# Patient Record
Sex: Female | Born: 1995 | Race: Black or African American | Hispanic: No | Marital: Single | Smoking: Never smoker
Health system: Southern US, Community
[De-identification: ages and names within clinical notes are randomized; demographics above are authoritative.]

## PROBLEM LIST (undated history)

## (undated) ENCOUNTER — Inpatient Hospital Stay (HOSPITAL_COMMUNITY): Payer: Self-pay

## (undated) ENCOUNTER — Ambulatory Visit (HOSPITAL_COMMUNITY): Admission: RE | Payer: Medicaid Other | Source: Home / Self Care | Admitting: Obstetrics & Gynecology

## (undated) DIAGNOSIS — E282 Polycystic ovarian syndrome: Secondary | ICD-10-CM

## (undated) HISTORY — PX: NO PAST SURGERIES: SHX2092

## (undated) HISTORY — DX: Polycystic ovarian syndrome: E28.2

## (undated) SURGERY — Surgical Case
Anesthesia: *Unknown

---

## 2014-02-03 ENCOUNTER — Encounter (HOSPITAL_COMMUNITY): Payer: Self-pay | Admitting: Emergency Medicine

## 2014-02-03 ENCOUNTER — Emergency Department (HOSPITAL_COMMUNITY)
Admission: EM | Admit: 2014-02-03 | Discharge: 2014-02-03 | Disposition: A | Discharge status: Home / Self Care | Payer: BC Managed Care – PPO | Attending: Emergency Medicine | Admitting: Emergency Medicine

## 2014-02-03 ENCOUNTER — Emergency Department (HOSPITAL_COMMUNITY): Payer: BC Managed Care – PPO

## 2014-02-03 DIAGNOSIS — R1032 Left lower quadrant pain: Secondary | ICD-10-CM

## 2014-02-03 DIAGNOSIS — N921 Excessive and frequent menstruation with irregular cycle: Secondary | ICD-10-CM

## 2014-02-03 DIAGNOSIS — N926 Irregular menstruation, unspecified: Secondary | ICD-10-CM | POA: Insufficient documentation

## 2014-02-03 DIAGNOSIS — R1031 Right lower quadrant pain: Secondary | ICD-10-CM

## 2014-02-03 DIAGNOSIS — N920 Excessive and frequent menstruation with regular cycle: Secondary | ICD-10-CM | POA: Insufficient documentation

## 2014-02-03 DIAGNOSIS — R51 Headache: Secondary | ICD-10-CM | POA: Insufficient documentation

## 2014-02-03 DIAGNOSIS — Z3202 Encounter for pregnancy test, result negative: Secondary | ICD-10-CM | POA: Insufficient documentation

## 2014-02-03 DIAGNOSIS — R1904 Left lower quadrant abdominal swelling, mass and lump: Secondary | ICD-10-CM | POA: Insufficient documentation

## 2014-02-03 DIAGNOSIS — R42 Dizziness and giddiness: Secondary | ICD-10-CM | POA: Insufficient documentation

## 2014-02-03 LAB — URINALYSIS, ROUTINE W REFLEX MICROSCOPIC
Bilirubin Urine: NEGATIVE
Glucose, UA: NEGATIVE mg/dL
Hgb urine dipstick: NEGATIVE
KETONES UR: NEGATIVE mg/dL
Leukocytes, UA: NEGATIVE
NITRITE: NEGATIVE
PH: 6.5 (ref 5.0–8.0)
Protein, ur: NEGATIVE mg/dL
SPECIFIC GRAVITY, URINE: 1.02 (ref 1.005–1.030)
Urobilinogen, UA: 1 mg/dL (ref 0.0–1.0)

## 2014-02-03 LAB — PREGNANCY, URINE: Preg Test, Ur: NEGATIVE

## 2014-02-03 LAB — CBC WITH DIFFERENTIAL/PLATELET
BASOS ABS: 0 10*3/uL (ref 0.0–0.1)
BASOS PCT: 0 % (ref 0–1)
EOS ABS: 0.1 10*3/uL (ref 0.0–0.7)
EOS PCT: 1 % (ref 0–5)
HCT: 36.7 % (ref 36.0–46.0)
Hemoglobin: 12.3 g/dL (ref 12.0–15.0)
Lymphocytes Relative: 45 % (ref 12–46)
Lymphs Abs: 3 10*3/uL (ref 0.7–4.0)
MCH: 31.5 pg (ref 26.0–34.0)
MCHC: 33.5 g/dL (ref 30.0–36.0)
MCV: 93.9 fL (ref 78.0–100.0)
Monocytes Absolute: 0.5 10*3/uL (ref 0.1–1.0)
Monocytes Relative: 7 % (ref 3–12)
Neutro Abs: 3.1 10*3/uL (ref 1.7–7.7)
Neutrophils Relative %: 47 % (ref 43–77)
PLATELETS: 266 10*3/uL (ref 150–400)
RBC: 3.91 MIL/uL (ref 3.87–5.11)
RDW: 12.4 % (ref 11.5–15.5)
WBC: 6.5 10*3/uL (ref 4.0–10.5)

## 2014-02-03 LAB — COMPREHENSIVE METABOLIC PANEL
ALBUMIN: 4.3 g/dL (ref 3.5–5.2)
ALK PHOS: 73 U/L (ref 39–117)
ALT: 8 U/L (ref 0–35)
AST: 17 U/L (ref 0–37)
BUN: 14 mg/dL (ref 6–23)
CALCIUM: 9.7 mg/dL (ref 8.4–10.5)
CO2: 26 mEq/L (ref 19–32)
Chloride: 101 mEq/L (ref 96–112)
Creatinine, Ser: 0.85 mg/dL (ref 0.50–1.10)
GFR calc non Af Amer: >90 mL/min (ref >90)
Glucose, Bld: 93 mg/dL (ref 70–99)
POTASSIUM: 4.2 meq/L (ref 3.7–5.3)
SODIUM: 142 meq/L (ref 137–147)
TOTAL PROTEIN: 7.4 g/dL (ref 6.0–8.3)
Total Bilirubin: 0.2 mg/dL — ABNORMAL LOW (ref 0.3–1.2)

## 2014-02-03 LAB — WET PREP, GENITAL
Trich, Wet Prep: NONE SEEN
Yeast Wet Prep HPF POC: NONE SEEN

## 2014-02-03 MED ORDER — NAPROXEN 500 MG PO TABS: 500.0000 mg | ORAL_TABLET | Freq: Two times a day (BID) | ORAL | Status: DC

## 2014-02-03 MED ORDER — KETOROLAC TROMETHAMINE 30 MG/ML IJ SOLN
30.0000 mg | Freq: Once | INTRAMUSCULAR | Status: AC
  Administered 2014-02-03: 30 mg via INTRAVENOUS
  Filled 2014-02-03: qty 1

## 2014-02-03 MED ORDER — HYDROCODONE-ACETAMINOPHEN 5-325 MG PO TABS
1.0000 | ORAL_TABLET | Freq: Once | ORAL | Status: AC
  Administered 2014-02-03: 1 via ORAL
  Filled 2014-02-03: qty 1

## 2014-02-03 MED ORDER — PROMETHAZINE HCL 25 MG PO TABS: 25.0000 mg | ORAL_TABLET | Freq: Four times a day (QID) | ORAL | Status: DC | PRN

## 2014-02-03 NOTE — ED Notes (Addendum)
Pt states she will skip a month occasionally with her menstrual cycle.  Pt last menstrual cycle December.  Pt states she has had heavy bleeding x 2 weeks.  Pt has taken 2 home pregnancy tests which are negative.  Pt states she occasionally gets a lump to her L pelvic area that gets bigger and is painful and then goes away.

## 2014-02-03 NOTE — ED Notes (Signed)
Verified with lab that urine specimen was received.

## 2014-02-03 NOTE — ED Provider Notes (Signed)
CSN: [id number]     Arrival date & time 02/03/14  1740 History   First MD Initiated Contact with Patient 02/03/14 1815     Chief Complaint  Patient presents with  . Vaginal Bleeding     (Consider location/radiation/quality/duration/timing/severity/associated sxs/prior Treatment) HPI  18 year old G0 P0 female with no significant past medical history presents for evaluation of vaginal bleeding. Patient reports for the past 2 weeks she has had progressive vaginal bleeding along with lower abdominal pain, and headache. Vaginal bleeding initially was light, but has been progressively worse. She is averaging about 2-4 tampons a day. Abdominal pain is described as a crampy sensation, radiates to both upper thighs, lower back, persistent, worsening with palpation. She occasionally will feel a knot in the left lower abdomen which at times expands and contracts. She has had this knot for the past 3 years. The knot is nonpainful. She endorses throbbing headache with sensation of lightheadedness and the status for the past 2 weeks. Headaches improved with taken Advil, ibuprofen, Aleve, and Tylenol at different times. She also endorsed low back pain for the same duration. Her last menstrual period was December 12. She is taking a pregnancy test twice most recently was 2 months ago and was negative. She is here for evaluation due to the chronicity of her complaints. She is sexually active with one partner, using protection on occasion. She denies fever, chills, neck stiffness, chest pain, shortness of breath, productive cough, hematuria, vaginal discharge, or rash.  History reviewed. No pertinent past medical history. History reviewed. No pertinent past surgical history. No family history on file. History  Substance Use Topics  . Smoking status: Never Smoker   . Smokeless tobacco: Not on file  . Alcohol Use: Yes   OB History   Grav Para Term Preterm Abortions TAB SAB Ect Mult Living                  Review of Systems  All other systems reviewed and are negative.      Allergies  Review of patient's allergies indicates no known allergies.  Home Medications   Current Outpatient Rx  Name  Route  Sig  Dispense  Refill  . ibuprofen (ADVIL,MOTRIN) 200 MG tablet   Oral   Take 200 mg by mouth every 6 (six) hours as needed for mild pain.         . naproxen sodium (ANAPROX) 220 MG tablet   Oral   Take 220 mg by mouth 2 (two) times daily with a meal.          BP 125/77  Pulse 84  Temp(Src) 98.3 F (36.8 C) (Oral)  Resp 18  SpO2 100%  LMP 10/05/2013 Physical Exam  Nursing note and vitals reviewed. Constitutional: She appears well-developed and well-nourished. No distress.  HENT:  Head: Normocephalic and atraumatic.  Eyes: Conjunctivae are normal.  Neck: Normal range of motion. Neck supple.  Cardiovascular: Normal rate and regular rhythm.   Pulmonary/Chest: Effort normal and breath sounds normal. She exhibits no tenderness.  Abdominal: Soft. There is tenderness (mild suprapubic tenderness without guarding or rebound tenderness. No hernia noted).  Genitourinary: Vagina normal and uterus normal. There is no rash or lesion on the right labia. There is no rash or lesion on the left labia. Cervix exhibits no motion tenderness and no discharge. Right adnexum displays no mass and no tenderness. Left adnexum displays no mass and no tenderness. No erythema, tenderness or bleeding around the vagina. No vaginal discharge found.  Chaperone present:  No inguinal lymphadenopathy. Normal labia majora and normal labia minora. Small amount of blood noted in vaginal vault. Mild discomfort with speculum insertion. Cervical os is closed. Left adnexal tenderness but no cervical motion tenderness.  Lymphadenopathy:       Right: No inguinal adenopathy present.       Left: No inguinal adenopathy present.    ED Course  Procedures (including critical care time)  7:35 PM Patient with  abnormal vaginal bleeding and abdominal pain. Her last menstrual period was December. Work up initiated.  9:40 PM Pregnancy test is negative, UA show no evidence of urinary tract infection, normal CBC and her electrolytes are reassuring. However it shows no significant signs of infection. Patient does have adnexal tenderness on exam and continued to endorse abdominal pain. Will obtain ultrasound to evaluate for ovarian cyst, tubo-ovarian abscess, urine fibroid however I have low suspicion for ovarian portion given to long duration of her symptoms. We'll continue with pain management  10:59 PM Pelvic US demonstrate multiple follicles with string of pearl appearance which cannot exclude polycystic ovarian disease.  Finding was discussed with patient.  Will give referral for close follow up.  Pain medication and antinausea medication prescribed.  Strict return precaution discussed.   Care discussed with Dr. Micheline Maze  Labs Review Labs Reviewed  WET PREP, GENITAL - Abnormal; Notable for the following:    Clue Cells Wet Prep HPF POC RARE (*)    WBC, Wet Prep HPF POC RARE (*)    All other components within normal limits  COMPREHENSIVE METABOLIC PANEL - Abnormal; Notable for the following:    Total Bilirubin 0.2 (*)    All other components within normal limits  GC/CHLAMYDIA PROBE AMP  URINALYSIS, ROUTINE W REFLEX MICROSCOPIC  PREGNANCY, URINE  CBC WITH DIFFERENTIAL   Imaging Review US Transvaginal Non-ob  02/03/2014   CLINICAL DATA:  Bleeding.  Pain.  Negative pregnancy test.  EXAM: TRANSABDOMINAL AND TRANSVAGINAL ULTRASOUND OF PELVIS  TECHNIQUE: Both transabdominal and transvaginal ultrasound examinations of the pelvis were performed. Transabdominal technique was performed for global imaging of the pelvis including uterus, ovaries, adnexal regions, and pelvic cul-de-sac. It was necessary to proceed with endovaginal exam following the transabdominal exam to visualize the endometrium.  COMPARISON:   None  FINDINGS: Uterus  Measurements: 9.5 x 3.1 x 4.3. No fibroids or other mass visualized.  Endometrium  Thickness: 3.5.  No focal abnormality visualized.  Right ovary  Measurements: 3.5 x 2.2 x 3.5 cm. Follicles have a string of pearl appearance. Polycystic kidney disease cannot be excluded.  Left ovary  Measurements: 3.3 x 1.8 x 2.7 cm. Follicles have a string of pearl appearance, polycystic ovarian disease cannot be excluded.  Other findings  Small amount of free pelvic fluid .  IMPRESSION: Multiple follicles with string of pearl appearance. Cannot exclude polycystic ovarian disease.   Electronically Signed   By: Maisie Fus  Register   On: 02/03/2014 22:50   US Pelvis Complete  02/03/2014   CLINICAL DATA:  Bleeding.  Pain.  Negative pregnancy test.  EXAM: TRANSABDOMINAL AND TRANSVAGINAL ULTRASOUND OF PELVIS  TECHNIQUE: Both transabdominal and transvaginal ultrasound examinations of the pelvis were performed. Transabdominal technique was performed for global imaging of the pelvis including uterus, ovaries, adnexal regions, and pelvic cul-de-sac. It was necessary to proceed with endovaginal exam following the transabdominal exam to visualize the endometrium.  COMPARISON:  None  FINDINGS: Uterus  Measurements: 9.5 x 3.1 x 4.3. No fibroids or other mass visualized.  Endometrium  Thickness: 3.5.  No focal abnormality visualized.  Right ovary  Measurements: 3.5 x 2.2 x 3.5 cm. Follicles have a string of pearl appearance. Polycystic kidney disease cannot be excluded.  Left ovary  Measurements: 3.3 x 1.8 x 2.7 cm. Follicles have a string of pearl appearance, polycystic ovarian disease cannot be excluded.  Other findings  Small amount of free pelvic fluid .  IMPRESSION: Multiple follicles with string of pearl appearance. Cannot exclude polycystic ovarian disease.   Electronically Signed   By: Maisie Fushomas  Register   On: 02/03/2014 22:50     EKG Interpretation None      MDM   Final diagnoses:  Menorrhagia with  irregular cycle  Abdominal pain, bilateral lower quadrant    BP 111/81  Pulse 66  Temp(Src) 98.3 F (36.8 C) (Oral)  Resp 18  SpO2 100%  LMP 10/05/2013  I have reviewed nursing notes and vital signs. I personally reviewed the imaging tests through PACS system  I reviewed available ER/hospitalization records thought the EMR     Fayrene HelperBowie Ravis Herne, [place] JerseyPA-C 02/03/14 2309

## 2014-02-03 NOTE — ED Notes (Signed)
She is also c/o dizziness and sweats

## 2014-02-03 NOTE — Discharge Instructions (Signed)
You have been evaluated for your abdominal pain and vaginal bleeding.  Abdominal ultrasound is showing finding which may suggest polycystic ovarian disease.  Please follow with [facility] for further evaluation and management of your condition.    Menorrhagia Menorrhagia is the medical term for when your menstrual periods are heavy or last longer than usual. With menorrhagia, every period you have may cause enough blood loss and cramping that you are unable to maintain your usual activities. CAUSES  In some cases, the cause of heavy periods is unknown, but a number of conditions may cause menorrhagia. Common causes include:  A problem with the hormone-producing thyroid gland (hypothyroid).  Noncancerous growths in the uterus (polyps or fibroids).  An imbalance of the estrogen and progesterone hormones.  One of your ovaries not releasing an egg during one or more months.  Side effects of having an intrauterine device (IUD).  Side effects of some medicines, such as anti-inflammatory medicines or blood thinners.  A bleeding disorder that stops your blood from clotting normally. SIGNS AND SYMPTOMS  During a normal period, bleeding lasts between 4 and 8 days. Signs that your periods are too heavy include:  You routinely have to change your pad or tampon every 1 or 2 hours because it is completely soaked.  You pass blood clots larger than 1 inch (2.5 cm) in size.  You have bleeding for more than 7 days.  You need to use pads and tampons at the same time because of heavy bleeding.  You need to wake up to change your pads or tampons during the night.  You have symptoms of anemia, such as tiredness, fatigue, or shortness of breath. DIAGNOSIS  Your health care provider will perform a physical exam and ask you questions about your symptoms and menstrual history. Other tests may be ordered based on what the health care provider finds during the exam. These tests can include:  Blood  tests To check if you are pregnant or have hormonal changes, a bleeding or thyroid disorder, low iron levels (anemia), or other problems.  Endometrial biopsy Your health care provider takes a sample of tissue from the inside of your uterus to be examined under a microscope.  Pelvic ultrasound This test uses sound waves to make a picture of your uterus, ovaries, and vagina. The pictures can show if you have fibroids or other growths.  Hysteroscopy For this test, your health care provider will use a small telescope to look inside your uterus. Based on the results of your initial tests, your health care provider may recommend further testing. TREATMENT  Treatment may not be needed. If it is needed, your health care provider may recommend treatment with one or more medicines first. If these do not reduce bleeding enough, a surgical treatment might be an option. The best treatment for you will depend on:   Whether you need to prevent pregnancy.  Your desire to have children in the future.  The cause and severity of your bleeding.  Your opinion and personal preference.  Medicines for menorrhagia may include:  Birth control methods that use hormones These include the pill, skin patch, vaginal ring, shots that you get every 3 months, hormonal IUD, and implant. These treatments reduce bleeding during your menstrual period.  Medicines that thicken blood and slow bleeding.  Medicines that reduce swelling, such as ibuprofen.  Medicines that contain a synthetic hormone called progestin.   Medicines that make the ovaries stop working for a short time.  You may  need surgical treatment for menorrhagia if the medicines are unsuccessful. Treatment options include:  Dilation and curettage (D&C) In this procedure, your health care provider opens (dilates) your cervix and then scrapes or suctions tissue from the lining of your uterus to reduce menstrual bleeding.  Operative hysteroscopy This  procedure uses a tiny tube with a light (hysteroscope) to view your uterine cavity and can help in the surgical removal of a polyp that may be causing heavy periods.  Endometrial ablation Through various techniques, your health care provider permanently destroys the entire lining of your uterus (endometrium). After endometrial ablation, most women have little or no menstrual flow. Endometrial ablation reduces your ability to become pregnant.  Endometrial resection This surgical procedure uses an electrosurgical wire loop to remove the lining of the uterus. This procedure also reduces your ability to become pregnant.  Hysterectomy Surgical removal of the uterus and cervix is a permanent procedure that stops menstrual periods. Pregnancy is not possible after a hysterectomy. This procedure requires anesthesia and hospitalization. HOME CARE INSTRUCTIONS   Only take over-the-counter or prescription medicines as directed by your health care provider. Take prescribed medicines exactly as directed. Do not change or switch medicines without consulting your health care provider.  Take any prescribed iron pills exactly as directed by your health care provider. Long-term heavy bleeding may result in low iron levels. Iron pills help replace the iron your body lost from heavy bleeding. Iron may cause constipation. If this becomes a problem, increase the bran, fruits, and roughage in your diet.  Do not take aspirin or medicines that contain aspirin 1 week before or during your menstrual period. Aspirin may make the bleeding worse.  If you need to change your sanitary pad or tampon more than once every 2 hours, stay in bed and rest as much as possible until the bleeding stops.  Eat well-balanced meals. Eat foods high in iron. Examples are leafy green vegetables, meat, liver, eggs, and whole grain breads and cereals. Do not try to lose weight until the abnormal bleeding has stopped and your blood iron level is back  to normal. SEEK MEDICAL CARE IF:   You soak through a pad or tampon every 1 or 2 hours, and this happens every time you have a period.  You need to use pads and tampons at the same time because you are bleeding so much.  You need to change your pad or tampon during the night.  You have a period that lasts for more than 8 days.  You pass clots bigger than 1 inch wide.  You have irregular periods that happen more or less often than once a month.  You feel dizzy or faint.  You feel very weak or tired.  You feel short of breath or feel your heart is beating too fast when you exercise.  You have nausea and vomiting or diarrhea while you are taking your medicine.  You have any problems that may be related to the medicine you are taking. SEEK IMMEDIATE MEDICAL CARE IF:   You soak through 4 or more pads or tampons in 2 hours.  You have any bleeding while you are pregnant. MAKE SURE YOU:   Understand these instructions.  Will watch your condition.  Will get help right away if you are not doing well or get worse. Document Released: 10/19/2005 Document Revised: 08/09/2013 Document Reviewed: 04/09/2013 [facility] Patient Information 2014 [place], [place].  Polycystic Ovarian Syndrome Polycystic ovarian syndrome (PCOS) is a common hormonal disorder among  women of reproductive age. Most women with PCOS grow many small cysts on their ovaries. PCOS can cause problems with your periods and make it difficult to get pregnant. It can also cause an increased risk of miscarriage with pregnancy. If left untreated, PCOS can lead to serious health problems, such as diabetes and heart disease. CAUSES The cause of PCOS is not fully understood, but genetics may be a factor. SIGNS AND SYMPTOMS   Infrequent or no menstrual periods.   Inability to get pregnant (infertility) because of not ovulating.   Increased growth of hair on the face, chest, stomach, back, thumbs, thighs, or toes.   Acne,  oily skin, or dandruff.   Pelvic pain.   Weight gain or obesity, usually carrying extra weight around the waist.   Type 2 diabetes.   High cholesterol.   High blood pressure.   Female-pattern baldness or thinning hair.   Patches of thickened and dark brown or black skin on the neck, arms, breasts, or thighs.   Tiny excess flaps of skin (skin tags) in the armpits or neck area.   Excessive snoring and having breathing stop at times while asleep (sleep apnea).   Deepening of the voice.   Gestational diabetes when pregnant.  DIAGNOSIS  There is no single test to diagnose PCOS.   Your health care provider will:   Take a medical history.   Perform a pelvic exam.   Have ultrasonography done.   Check your female and female hormone levels.   Measure glucose or sugar levels in the blood.   Do other blood tests.   If you are producing too many female hormones, your health care provider will make sure it is from PCOS. At the physical exam, your health care provider will want to evaluate the areas of increased hair growth. Try to allow natural hair growth for a few days before the visit.   During a pelvic exam, the ovaries may be enlarged or swollen because of the increased number of small cysts. This can be seen more easily by using vaginal ultrasonography or screening to examine the ovaries and lining of the uterus (endometrium) for cysts. The uterine lining may become thicker if you have not been having a regular period.  TREATMENT  Because there is no cure for PCOS, it needs to be managed to prevent problems. Treatments are based on your symptoms. Treatment is also based on whether you want to have a baby or whether you need contraception.  Treatment may include:   Progesterone hormone to start a menstrual period.   Birth control pills to make you have regular menstrual periods.   Medicines to make you ovulate, if you want to get pregnant.   Medicines  to control your insulin.   Medicine to control your blood pressure.   Medicine and diet to control your high cholesterol and triglycerides in your blood.  Medicine to reduce excessive hair growth.  Surgery, making small holes in the ovary, to decrease the amount of female hormone production. This is done through a long, lighted tube (laparoscope) placed into the pelvis through a tiny incision in the lower abdomen.  HOME CARE INSTRUCTIONS  Only take over-the-counter or prescription medicine as directed by your health care provider.  Pay attention to the foods you eat and your activity levels. This can help reduce the effects of PCOS.  Keep your weight under control.  Eat foods that are low in carbohydrate and high in fiber.  Exercise regularly. SEEK MEDICAL  CARE IF:  Your symptoms do not get better with medicine.  You have new symptoms. Document Released: 02/12/2005 Document Revised: 08/09/2013 Document Reviewed: 04/06/2013 [facility] Patient Information 2014 [place], [place].   Emergency Department Resource Guide 1) Find a Doctor and Pay Out of Pocket Although you won't have to find out who is covered by your insurance plan, it is a good idea to ask around and get recommendations. You will then need to call the office and see if the doctor you have chosen will accept you as a new patient and what types of options they offer for patients who are self-pay. Some doctors offer discounts or will set up payment plans for their patients who do not have insurance, but you will need to ask so you aren't surprised when you get to your appointment.  2) Contact Your Local Health Department Not all health departments have doctors that can see patients for sick visits, but many do, so it is worth a call to see if yours does. If you don't know where your local health department is, you can check in your phone book. The CDC also has a tool to help you locate your state's health department, and many  state websites also have listings of all of their local health departments.  3) Find a Walk-in Clinic If your illness is not likely to be very severe or complicated, you may want to try a walk in clinic. These are popping up all over the country in pharmacies, drugstores, and shopping centers. They're usually staffed by nurse practitioners or physician assistants that have been trained to treat common illnesses and complaints. They're usually fairly quick and inexpensive. However, if you have serious medical issues or chronic medical problems, these are probably not your best option.  No Primary Care Doctor: - Call Health Connect at  [phone] - they can help you locate a primary care doctor that  accepts your insurance, provides certain services, etc. - Physician Referral Service- [phone]  Chronic Pain Problems: Organization         Address  Phone   Notes  [facility]  [phone] Patients need to be referred by their primary care doctor.   Medication Assistance: Organization         Address  Phone   Notes  [facility] Medication [facility] [address] [place], [place] [address] [phone] --Must be a resident of [facility] -- Must have NO insurance coverage whatsoever (no Medicaid/ Medicare, etc.) -- The pt. MUST have a primary care doctor that directs their care regularly and follows them in the community   MedAssist  [phone]   [employer]  [phone]    Agencies that provide inexpensive medical care: Organization         Address  Phone   Notes  [facility]  [phone]   [facility]    [phone]   [facility] [address] [place], [place] [address] [phone]   [facility] [address] [place] [place] [phone]   Planned Parenthood    [phone]   [facility]    [phone]   [facility]  [address], [place] Phone:  [phone], Fax:  [phone] Hours of Operation:  9 am - 6 pm, M-F.  Also accepts Medicaid/Medicare and self-pay.  [facility]  [address], [place] Phone: [phone], Fax: [phone]. Hours of Operation:  8:30 am - 5:30 pm, M-F.  Also accepts Medicaid and self-pay.  [facility] [place] [address], [place] Phone: [phone]   [facility] [address] Natasha Bence [place], [place] [phone], Ext. [phone] Mondays & Thursdays: 7-9 AM.  First 15 patients are seen on a first come, first serve basis.    Medicaid-accepting [facility] Providers:  Organization         Address  Phone   Notes  [facility] [address], [place] [phone] Also accepts self-pay patients.  [facility] [address] Laurell Josephs [place], [place]  [phone]   [facility] [address], [place] [phone]   [facility] [address] [place] [phone]   Renaye Rakers [address], [place]   [phone] Only accepts [place] patients after they have their name applied to their card.   Self-Pay (no insurance) in [facility]:  Organization         Address  Phone   Notes  Sickle Cell Patients, [facility] [address] [place], [place] [phone]   [facility] Urgent Care [address] [place], [place] [phone]   [facility] [place]  [address], [place] [phone]   [facility]/Dr. Osei-Bonsu  [address], [place] or [address], [place] [phone] Phone number for both [place] and [place] locations is the same.  [facility] [address], [place] [phone]   [facility] [address] [place] or [address] [phone]   [facility] [address] [place] [phone], phone; [phone], fax Sees patients 1st and 3rd Saturday of every month.  Must not qualify for public or private insurance (i.e. Medicaid, Medicare, [place] Health Choice, Veterans' Benefits)  Household income should be no more than 200% of the poverty level The clinic cannot treat you if you are pregnant or think you are pregnant  Sexually transmitted diseases are not treated at the clinic.    Dental Care: Organization         Address  Phone  Notes  [facility] [address] [place], [place] [phone] Accepts children up to age 16 who are enrolled in [place] or [place] Health Choice; pregnant women with a Medicaid card; and children who have applied for Medicaid or [place] Health Choice, but were declined, whose parents can pay a reduced fee at time of service.  [facility]  [address], [place] [phone] Accepts children up to age 35 who are enrolled in [place] or [place] Health Choice; pregnant women with a Medicaid card; and children who have applied for Medicaid or [place] Health Choice, but were declined, whose parents can pay a reduced fee at time of service.  [facility]  [address] [place], [place] [phone] Patients are seen by appointment only. Walk-ins are not accepted. [facility] will see patients 4 years of age and older. Monday - Tuesday (8am-5pm) Most Wednesdays (8:30-5pm) $30 per visit, cash only  [employer] Adult [facility]  Access PROGRAM  [address], [facility] [phone] Patients are seen by appointment only. Walk-ins are not accepted. [facility] will see patients 63 years of age and older. One Wednesday Evening (Monthly: Volunteer Based).  $30 per visit, cash only  [employer]  [phone] for adults; Children under age 56, call Graduate Pediatric Dentistry at [phone]. Children aged 20-14, please call [phone] to request a pediatric application.  Dental services are provided in all areas of dental care including fillings, crowns and bridges, complete and partial dentures, implants, gum treatment, root canals, and extractions. Preventive care is also provided. Treatment is provided to both adults and children. Patients are selected via a lottery and there is often a waiting list.   [facility] [address], [place]  [phone] [email or web address]   [facility] [address] [place], [place] [phone], Ext. [phone] Second and Fourth Thursday of each month, opens at 6:30 AM; Clinic ends at 9 AM.  Patients are seen on a first-come first-served basis, and a limited number are seen during each clinic.   [facility]  [address] Ether Griffins [place], [place] [phone]   Eligibility Requirements You must have lived in [place], [place], or [place] counties for at least the last three months.   You cannot be eligible for state or federal sponsored [place], including [employer], [place], or [employer].   You generally cannot be eligible for healthcare insurance through your employer.    How to apply: Eligibility screenings are held every Tuesday and Wednesday afternoon from 1:00 pm until 4:00 pm. You do not need an appointment for the interview!  [facility] [address], [place], [place] [phone]   [facility] Health Department  [phone]   [facility] Health Department  [phone]   [facility] Health Department  [phone]    Behavioral Health Resources in the Community: Intensive Outpatient Programs Organization         Address  Phone  Notes  [facility] [address] [place], [place]  [phone]   [facility] Outpatient [address], [place], [place] [phone]   [facility] [address], [place], [place]  [phone]   [facility] Mental Health [address],  [place], [place] [phone] or [phone]   Substance Abuse Resources Organization         Address  Phone  Notes  [facility]  [phone]   [facility]  [phone]   [facility] [place]  [phone]   Floydene Flock  [phone]   Residential & Outpatient Substance Abuse Program  [phone]   Psychological Services Organization         Address  Phone  Notes  [facility] Behavioral Health  [phone]   [facility]  [phone]   [facility] Mental Health [address] [place] [phone] or [phone]    Mobile Crisis Teams Organization         Address  Phone  Notes  [facility], Mobile Crisis Care Unit  [phone]   [facility]  [address] [place], [place] [phone]   Doristine Locks [address] [place] [phone]    Self-Help/Support Groups Organization         Address  Phone             Notes  [facility]  [facility] [place] - variety of support groups  336- [phone] Call for more information  Narcotics Anonymous (NA), [facility] [address], [place]  2 meetings at this location   Statisticianesidential Treatment Programs Organization         Address  Phone  Notes  [facility] [address],    [place]  [phone]   [facility]  [address], [place] [id number], [place], [place] [phone]   [facility] [address], [place] [phone] Admissions: 8am-3pm M-F  [facility] [address]    [place], [place] [phone]   [facility] [address], [place], [place] [phone]   [facility] [address]    [place], [place] [phone]   [facility] - Intensive Outpatient [address], [place], [place] [phone]   [facility] ([facility] [address]  [place], [place] [phone] or [phone]   [facility] ([facility]) [address] [place], [place] [phone] Accepts Medicaid  [facility] [address]  [place] [phone] Substance Abuse/Addiction Treatment   [facility] Behavioral Health Resources Organization         Address  Phone  Notes  [facility]  [phone]   Angie FavaJulie Brannon, PhD [address] [place], [place]   [phone] or [phone]   [facility]   [address] [place], [place] [phone]   [facility] [address], [place], [place] [phone]) [phone] Insurance/Medicaid/sponsorship through [facility]  [facility] Families [address]                                    [place], [place] [phone] Therapy/tele-psych/case  [facility] [facility] [address]   [place], [place] [address] 673 [phone]    Dr. Lolly MustacheArfeen  [phone]   [facility]  [facility] 1) [address], [place] 2) [address], [place] 3)  [address], [place] [phone]  [phone]   [facility] Hotline [phone] or [phone] (After Hours)

## 2014-02-03 NOTE — ED Notes (Signed)
The pt is c/o  Vaginal bleeding  For 2 weeks.  Her lmp was dec 2014.  No reason .  Listed her periiods have been regular until  [place]

## 2014-02-03 NOTE — ED Notes (Signed)
Also c/o pain in her lower back

## 2014-02-03 NOTE — ED Notes (Signed)
Pt currently in US.

## 2014-02-04 NOTE — ED Provider Notes (Signed)
Medical screening examination/treatment/procedure(s) were performed by non-physician practitioner and as supervising physician I was immediately available for consultation/collaboration.   Megan E Docherty, MD 02/04/14 1038 

## 2014-02-05 ENCOUNTER — Inpatient Hospital Stay (HOSPITAL_COMMUNITY)
Admission: AD | Admit: 2014-02-05 | Discharge: 2014-02-05 | Disposition: A | Discharge status: Home / Self Care | Payer: BC Managed Care – PPO | Source: Ambulatory Visit | Attending: Obstetrics & Gynecology | Admitting: Obstetrics & Gynecology

## 2014-02-05 DIAGNOSIS — R109 Unspecified abdominal pain: Secondary | ICD-10-CM | POA: Insufficient documentation

## 2014-02-05 DIAGNOSIS — N926 Irregular menstruation, unspecified: Secondary | ICD-10-CM | POA: Insufficient documentation

## 2014-02-05 LAB — CBC
HCT: 36.3 % (ref 36.0–46.0)
Hemoglobin: 12.2 g/dL (ref 12.0–15.0)
MCH: 31.5 pg (ref 26.0–34.0)
MCHC: 33.6 g/dL (ref 30.0–36.0)
MCV: 93.8 fL (ref 78.0–100.0)
PLATELETS: 243 10*3/uL (ref 150–400)
RBC: 3.87 MIL/uL (ref 3.87–5.11)
RDW: 12.5 % (ref 11.5–15.5)
WBC: 5.6 10*3/uL (ref 4.0–10.5)

## 2014-02-05 LAB — GC/CHLAMYDIA PROBE AMP
CT Probe RNA: NEGATIVE
GC Probe RNA: NEGATIVE

## 2014-02-05 NOTE — MAU Provider Note (Signed)
History     CSN: [id number]  Arrival date and time: 02/05/14 1430   First Provider Initiated Contact with Patient 02/05/14 1550      Chief Complaint  Patient presents with  . Abdominal Pain  . Vaginal Bleeding   HPI Renee Acosta is a 18 y.o. female who presents to MAU today for "further work-up for PCOS." The patient was seen at [facility] on 02/03/14 and had labs, pelvic exam and [place]. No signs of infection, Hgb was stable and US showed ?PCOS. The patient was told to follow-up with GYN. She states that symptoms have not changed. She continues to have vaginal bleeding daily x 3 weeks. She reports changing her pad 2-4 times per day. She denies weakness, dizziness today. She states that she continues to have some lower abdominal pain. She was given Rx for Naproxen which she has not filled. She has not taken any pain medication. She does not desire pregnancy.   OB History   Grav Para Term Preterm Abortions TAB SAB Ect Mult Living                  No past medical history on file.  No past surgical history on file.  No family history on file.  History  Substance Use Topics  . Smoking status: Never Smoker   . Smokeless tobacco: Not on file  . Alcohol Use: Yes    Allergies: No Known Allergies  Prescriptions prior to admission  Medication Sig Dispense Refill  . ibuprofen (ADVIL,MOTRIN) 200 MG tablet Take 200 mg by mouth every 6 (six) hours as needed for mild pain.      . naproxen (NAPROSYN) 500 MG tablet Take 1 tablet (500 mg total) by mouth 2 (two) times daily.  20 tablet  0  . promethazine (PHENERGAN) 25 MG tablet Take 1 tablet (25 mg total) by mouth every 6 (six) hours as needed for nausea.  20 tablet  0    Review of Systems  Constitutional: Negative for fever and malaise/fatigue.  Gastrointestinal: Positive for abdominal pain.  Genitourinary:       + vaginal bleeding  Neurological: Negative for weakness.   Physical Exam   Blood pressure 104/69, pulse 66, resp. rate 16,  height 4\' 11"  (1.499 m), weight 55.43 kg (122 lb 3.2 oz), last menstrual period 10/05/2013, SpO2 98.00%.  Physical Exam  Constitutional: She is oriented to person, place, and time. She appears well-developed and well-nourished. No distress.  HENT:  Head: Normocephalic and atraumatic.  Cardiovascular: Normal rate.   Respiratory: Effort normal.  GI: Soft.  Neurological: She is alert and oriented to person, place, and time.  Skin: Skin is warm and dry. No erythema.  Psychiatric: She has a normal mood and affect.   Results for orders placed during the hospital encounter of 02/05/14 (from the past 24 hour(s))  CBC     Status: None   Collection Time    02/05/14  4:00 PM      Result Value Ref Range   WBC 5.6  4.0 - 10.5 K/uL   RBC 3.87  3.87 - 5.11 MIL/uL   Hemoglobin 12.2  12.0 - 15.0 g/dL   HCT 40.9  81.1 - 91.4 %   MCV 93.8  78.0 - 100.0 fL   MCH 31.5  26.0 - 34.0 pg   MCHC 33.6  30.0 - 36.0 g/dL   RDW 78.2  95.6 - 21.3 %   Platelets 243  150 - 400 K/uL  MAU Course  Procedures None  MDM CBC today shows stable Hgb Patient declines OCPs today. States that her family members develop breast cyst and weight gain on OCPs. Assessment and Plan  A: Irregular menses  P: Discharge home Patient [facility] Bleeding precautions discussed Patient to follow-up with [facility] on Monday, April 13th @ 1:00 pm Patient may return to MAU as needed or if her condition were to change or worsen  Freddi StarrJulie N Ethier, PA-C  02/05/2014, 3:50 PM

## 2014-02-05 NOTE — MAU Note (Signed)
Patient [facility]. States she continues to have abdominal pain and bleeding, breast pain, chest pain, back pain and headaches. Came to MAU for further evaluation because she continues to have pain.

## 2014-02-05 NOTE — Discharge Instructions (Signed)
Abnormal Uterine Bleeding Abnormal uterine bleeding means bleeding from the vagina that is not your normal menstrual period. This can be:  Bleeding or spotting between periods.  Bleeding after sex (sexual intercourse).  Bleeding that is heavier or more than normal.  Periods that last longer than usual.  Bleeding after menopause. There are many problems that may cause this. Treatment will depend on the cause of the bleeding. Any kind of bleeding that is not normal should be reviewed by your doctor.  HOME CARE Watch your condition for any changes. These actions may lessen any discomfort you are having:  Do not use tampons or douches as told by your doctor.  Change your pads often. You should get regular pelvic exams and Pap tests. Keep all appointments for tests as told by your doctor. GET HELP IF:  You are bleeding for more than 1 week.  You feel dizzy at times. GET HELP RIGHT AWAY IF:   You pass out.  You have to change pads every 15 to 30 minutes.  You have belly pain.  You have a fever.  You become sweaty or weak.  You are passing large blood clots from the vagina.  You feel sick to your stomach (nauseous) and throw up (vomit). MAKE SURE YOU:  Understand these instructions.  Will watch your condition.  Will get help right away if you are not doing well or get worse. Document Released: 08/16/2009 Document Revised: 08/09/2013 Document Reviewed: 05/18/2013 ExitCare Patient Information 2014 [employer].  

## 2014-02-05 NOTE — MAU Provider Note (Signed)
Attestation of Attending Supervision of Advanced Practitioner (CNM/NP): Evaluation and management procedures were performed by the Advanced Practitioner under my supervision and collaboration.  I have reviewed the Advanced Practitioner's note and chart, and I agree with the management and plan.  HARRAWAY-SMITH, Dainelle Hun 10:28 PM     

## 2014-02-12 ENCOUNTER — Encounter: Payer: BC Managed Care – PPO | Admitting: Obstetrics & Gynecology

## 2014-02-12 ENCOUNTER — Telehealth: Payer: Self-pay

## 2014-02-12 NOTE — Telephone Encounter (Signed)
Pt. Missed appointment today for MAU f/u with Dr. Marice Potterove. Called pt. No answer. Left message for pt. To call and re-schedule. Will also send letter.

## 2016-07-21 ENCOUNTER — Other Ambulatory Visit: Payer: Self-pay | Admitting: Family

## 2016-07-21 DIAGNOSIS — G44309 Post-traumatic headache, unspecified, not intractable: Secondary | ICD-10-CM

## 2016-07-23 ENCOUNTER — Other Ambulatory Visit: Payer: Self-pay

## 2016-07-27 ENCOUNTER — Other Ambulatory Visit: Payer: Self-pay

## 2016-08-03 ENCOUNTER — Other Ambulatory Visit: Payer: Self-pay

## 2016-09-01 ENCOUNTER — Ambulatory Visit: Payer: BLUE CROSS/BLUE SHIELD | Admitting: Neurology

## 2016-09-02 ENCOUNTER — Encounter: Payer: Self-pay | Admitting: Neurology

## 2016-09-02 ENCOUNTER — Ambulatory Visit (INDEPENDENT_AMBULATORY_CARE_PROVIDER_SITE_OTHER): Payer: BLUE CROSS/BLUE SHIELD | Admitting: Neurology

## 2016-09-02 VITALS — BP 110/68 | HR 71 | Ht 60.0 in | Wt 131.4 lb

## 2016-09-02 DIAGNOSIS — G8929 Other chronic pain: Secondary | ICD-10-CM

## 2016-09-02 DIAGNOSIS — R202 Paresthesia of skin: Secondary | ICD-10-CM | POA: Diagnosis not present

## 2016-09-02 DIAGNOSIS — R51 Headache: Secondary | ICD-10-CM

## 2016-09-02 DIAGNOSIS — H8149 Vertigo of central origin, unspecified ear: Secondary | ICD-10-CM

## 2016-09-02 DIAGNOSIS — H539 Unspecified visual disturbance: Secondary | ICD-10-CM

## 2016-09-02 DIAGNOSIS — R519 Headache, unspecified: Secondary | ICD-10-CM

## 2016-09-02 DIAGNOSIS — H814 Vertigo of central origin: Secondary | ICD-10-CM

## 2016-09-02 MED ORDER — GABAPENTIN 100 MG PO CAPS: 100.0000 mg | ORAL_CAPSULE | Freq: Three times a day (TID) | ORAL | 3 refills | Status: DC
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
## 2016-09-02 NOTE — Patient Instructions (Addendum)
Remember to drink plenty of fluid, eat healthy meals and do not skip any meals. Try to eat protein with a every meal and eat a healthy snack such as fruit or nuts in between meals. Try to keep a regular sleep-wake schedule and try to exercise daily, particularly in the form of walking, 20-30 minutes a day, if you can.   As far as your medications are concerned, I would like to suggest: Gabapentin 100mg  three times a day  As far as diagnostic testing: MRI of the brain, labs  Our phone number is [phone]. We also have an after hours call service for urgent matters and there is a physician on-call for urgent questions. For any emergencies you know to call 911 or go to the nearest emergency room  Gabapentin capsules or tablets What is this medicine? GABAPENTIN (GA ba pen tin) is used to control partial seizures in adults with epilepsy. It is also used to treat certain types of nerve pain. This medicine may be used for other purposes; ask your health care provider or pharmacist if you have questions. What should I tell my health care provider before I take this medicine? They need to know if you have any of these conditions: -kidney disease -suicidal thoughts, plans, or attempt; a previous suicide attempt by you or a family member -an unusual or allergic reaction to gabapentin, other medicines, foods, dyes, or preservatives -pregnant or trying to get pregnant -breast-feeding How should I use this medicine? Take this medicine by mouth with a glass of water. Follow the directions on the prescription label. You can take it with or without food. If it upsets your stomach, take it with food.Take your medicine at regular intervals. Do not take it more often than directed. Do not stop taking except on your doctor's advice. If you are directed to break the 600 or 800 mg tablets in half as part of your dose, the extra half tablet should be used for the next dose. If you have not used the extra half tablet  within 28 days, it should be thrown away. A special MedGuide will be given to you by the pharmacist with each prescription and refill. Be sure to read this information carefully each time. Talk to your pediatrician regarding the use of this medicine in children. Special care may be needed. Overdosage: If you think you have taken too much of this medicine contact a poison control center or emergency room at once. NOTE: This medicine is only for you. Do not share this medicine with others. What if I miss a dose? If you miss a dose, take it as soon as you can. If it is almost time for your next dose, take only that dose. Do not take double or extra doses. What may interact with this medicine? Do not take this medicine with any of the following medications: -other gabapentin products This medicine may also interact with the following medications: -alcohol -antacids -antihistamines for allergy, cough and cold -certain medicines for anxiety or sleep -certain medicines for depression or psychotic disturbances -homatropine; hydrocodone -naproxen -narcotic medicines (opiates) for pain -phenothiazines like chlorpromazine, mesoridazine, prochlorperazine, thioridazine This list may not describe all possible interactions. Give your health care provider a list of all the medicines, herbs, non-prescription drugs, or dietary supplements you use. Also tell them if you smoke, drink alcohol, or use illegal drugs. Some items may interact with your medicine. What should I watch for while using this medicine? Visit your doctor or health care professional  for regular checks on your progress. You may want to keep a record at home of how you feel your condition is responding to treatment. You may want to share this information with your doctor or health care professional at each visit. You should contact your doctor or health care professional if your seizures get worse or if you have any new types of seizures. Do not  stop taking this medicine or any of your seizure medicines unless instructed by your doctor or health care professional. Stopping your medicine suddenly can increase your seizures or their severity. Wear a medical identification bracelet or chain if you are taking this medicine for seizures, and carry a card that lists all your medications. You may get drowsy, dizzy, or have blurred vision. Do not drive, use machinery, or do anything that needs mental alertness until you know how this medicine affects you. To reduce dizzy or fainting spells, do not sit or stand up quickly, especially if you are an older patient. Alcohol can increase drowsiness and dizziness. Avoid alcoholic drinks. Your mouth may get dry. Chewing sugarless gum or sucking hard candy, and drinking plenty of water will help. The use of this medicine may increase the chance of suicidal thoughts or actions. Pay special attention to how you are responding while on this medicine. Any worsening of mood, or thoughts of suicide or dying should be reported to your health care professional right away. Women who become pregnant while using this medicine may enroll in the [place] American Antiepileptic Drug Pregnancy Registry by calling [phone]. This registry collects information about the safety of antiepileptic drug use during pregnancy. What side effects may I notice from receiving this medicine? Side effects that you should report to your doctor or health care professional as soon as possible: -allergic reactions like skin rash, itching or hives, swelling of the face, lips, or tongue -worsening of mood, thoughts or actions of suicide or dying Side effects that usually do not require medical attention (report to your doctor or health care professional if they continue or are bothersome): -constipation -difficulty walking or controlling muscle movements -dizziness -nausea -slurred speech -tiredness -tremors -weight gain This list may not  describe all possible side effects. Call your doctor for medical advice about side effects. You may report side effects to FDA at 1-800-FDA-1088. Where should I keep my medicine? Keep out of reach of children. This medicine may cause accidental overdose and death if it taken by other adults, children, or pets. Mix any unused medicine with a substance like cat litter or coffee grounds. Then throw the medicine away in a sealed container like a sealed bag or a coffee can with a lid. Do not use the medicine after the expiration date. Store at room temperature between 15 and 30 degrees C (59 and 86 degrees F). NOTE: This sheet is a summary. It may not cover all possible information. If you have questions about this medicine, talk to your doctor, pharmacist, or health care provider.    2016, Elsevier/Gold Standard. (2013-12-15 15:26:50)

## 2016-09-03 ENCOUNTER — Telehealth: Payer: Self-pay | Admitting: *Deleted

## 2016-09-03 ENCOUNTER — Telehealth: Payer: Self-pay | Admitting: Neurology

## 2016-09-03 DIAGNOSIS — R519 Headache, unspecified: Secondary | ICD-10-CM | POA: Insufficient documentation

## 2016-09-03 DIAGNOSIS — R51 Headache: Secondary | ICD-10-CM

## 2016-09-03 LAB — COMPREHENSIVE METABOLIC PANEL
ALT: 7 IU/L (ref 0–32)
AST: 15 IU/L (ref 0–40)
Albumin/Globulin Ratio: 1.7 (ref 1.2–2.2)
Albumin: 4.5 g/dL (ref 3.5–5.5)
Alkaline Phosphatase: 53 IU/L (ref 39–117)
BUN/Creatinine Ratio: 13 (ref 9–23)
BUN: 10 mg/dL (ref 6–20)
Bilirubin Total: 0.4 mg/dL (ref 0.0–1.2)
CALCIUM: 9.5 mg/dL (ref 8.7–10.2)
CO2: 21 mmol/L (ref 18–29)
CREATININE: 0.8 mg/dL (ref 0.57–1.00)
Chloride: 102 mmol/L (ref 96–106)
GFR calc Af Amer: 122 mL/min/{1.73_m2} (ref >59)
GFR, EST NON AFRICAN AMERICAN: 106 mL/min/{1.73_m2} (ref >59)
GLUCOSE: 93 mg/dL (ref 65–99)
Globulin, Total: 2.7 g/dL (ref 1.5–4.5)
Potassium: 4.6 mmol/L (ref 3.5–5.2)
Sodium: 143 mmol/L (ref 134–144)
Total Protein: 7.2 g/dL (ref 6.0–8.5)

## 2016-09-03 LAB — CBC
HEMOGLOBIN: 12.4 g/dL (ref 11.1–15.9)
Hematocrit: 36.9 % (ref 34.0–46.6)
MCH: 30.8 pg (ref 26.6–33.0)
MCHC: 33.6 g/dL (ref 31.5–35.7)
MCV: 92 fL (ref 79–97)
Platelets: 281 10*3/uL (ref 150–379)
RBC: 4.02 x10E6/uL (ref 3.77–5.28)
RDW: 12.8 % (ref 12.3–15.4)
WBC: 5.3 10*3/uL (ref 3.4–10.8)

## 2016-09-03 NOTE — Telephone Encounter (Signed)
Called and LVM for pt advising labs normal per Dr Lucia GaskinsAhern. Gave [facility] phone number if she has further questions.

## 2016-09-03 NOTE — Telephone Encounter (Signed)
Noted, thank you

## 2016-09-03 NOTE — Telephone Encounter (Signed)
Just got off the phone with [employer] to get her approved for her MRI. It did not get a approved and it needs a peer to peer. The phone number is [phone] Need the patient name Renee Acosta DOB is 09-30-1996 and her member ID is [id number] [phone]. Please do in 2 business days, thank you.

## 2016-09-03 NOTE — Telephone Encounter (Signed)
Approved! [id number] good through 11/2-12/11/2015

## 2016-09-03 NOTE — Telephone Encounter (Signed)
-----   Message from Anson FretAntonia B Ahern, MD sent at 09/03/2016  9:40 AM EDT ----- Labs normal

## 2016-09-09 ENCOUNTER — Other Ambulatory Visit: Payer: BLUE CROSS/BLUE SHIELD

## 2016-09-16 ENCOUNTER — Ambulatory Visit (INDEPENDENT_AMBULATORY_CARE_PROVIDER_SITE_OTHER): Payer: BLUE CROSS/BLUE SHIELD

## 2016-09-16 DIAGNOSIS — G8929 Other chronic pain: Secondary | ICD-10-CM

## 2016-09-16 DIAGNOSIS — H814 Vertigo of central origin: Secondary | ICD-10-CM

## 2016-09-16 DIAGNOSIS — R51 Headache: Secondary | ICD-10-CM

## 2016-09-16 DIAGNOSIS — H539 Unspecified visual disturbance: Secondary | ICD-10-CM

## 2016-09-16 DIAGNOSIS — H8149 Vertigo of central origin, unspecified ear: Secondary | ICD-10-CM

## 2016-09-16 DIAGNOSIS — R202 Paresthesia of skin: Secondary | ICD-10-CM | POA: Diagnosis not present

## 2016-09-16 DIAGNOSIS — R519 Headache, unspecified: Secondary | ICD-10-CM

## 2016-09-17 MED ORDER — GADOPENTETATE DIMEGLUMINE 469.01 MG/ML IV SOLN: 12.0000 mL | Freq: Once | INTRAVENOUS | Status: AC | PRN

## 2016-09-22 ENCOUNTER — Telehealth: Payer: Self-pay

## 2016-09-22 NOTE — Telephone Encounter (Signed)
-----   Message from Anson FretAntonia B Ahern, MD sent at 09/22/2016  1:19 PM EST ----- MRI of the brain is normal thanks

## 2016-09-22 NOTE — Telephone Encounter (Signed)
I called pt and advised her that her MRI brain was normal. Pt verbalized understanding. Pt says that she has not started taking the gabapentin yet but will start it now since she knows that her MRI was normal. Pt will let us know if her she experiences side effects from the gabapentin and if it helps her headaches.

## 2016-11-02 NOTE — L&D Delivery Note (Addendum)
Delivery Note At 7:05 AM, on October 17, 2017, a viable female "Renee Acosta" was delivered via Vaginal, Spontaneous (Presentation:Left Occiput Anterior with restitution to OA).  After delivery of head, nuchal cord noted that shoulders and body was delivered through via somersault maneuver. Infant with good tone, but minimal grimace requiring tactile stimulation and bulb suction given by provider.  Infant placed on mother's abdomen where nurse continued tactile stimulation. Infant APGAR: 7, 9.  Cord clamped, cut, and blood collected. Placenta delivered spontaneously and noted inspected by this provider.  Laceration noted and repair in progress by Dr. Kathie RhodesS. Hykeem Ojeda.   Mother hemodynamically stable and infant skin to skin prior to this provider exit.  Mother unsure of method for birth control and opts to breastfeed.   Family wishes for infant to be circumcised in outpatient setting.  Infant weight at one hour of life: Pending  Anesthesia:  Local for Repair Episiotomy: None Lacerations: TBD Suture Repair: 3.0 vicryl Est. Blood Loss (mL):  TBD  Mom to postpartum.  Baby to Couplet care / Skin to Skin.  Cherre RobinsJessica L Emly MSN, CNM 10/17/2017, 7:19 AM  Left vulvar and right vaginal lacerations repaired under local anesthesia with 1% Lidocaine and interrupted sutures of 3-0 Vicryl EBL: 250 cc

## 2016-11-07 ENCOUNTER — Encounter (HOSPITAL_COMMUNITY): Payer: Self-pay | Admitting: *Deleted

## 2016-11-07 ENCOUNTER — Emergency Department (HOSPITAL_COMMUNITY)
Admission: EM | Admit: 2016-11-07 | Discharge: 2016-11-07 | Disposition: A | Discharge status: Home / Self Care | Payer: BLUE CROSS/BLUE SHIELD | Attending: Emergency Medicine | Admitting: Emergency Medicine

## 2016-11-07 ENCOUNTER — Emergency Department (HOSPITAL_COMMUNITY): Payer: BLUE CROSS/BLUE SHIELD

## 2016-11-07 DIAGNOSIS — Y92410 Unspecified street and highway as the place of occurrence of the external cause: Secondary | ICD-10-CM | POA: Insufficient documentation

## 2016-11-07 DIAGNOSIS — Y999 Unspecified external cause status: Secondary | ICD-10-CM | POA: Insufficient documentation

## 2016-11-07 DIAGNOSIS — S134XXA Sprain of ligaments of cervical spine, initial encounter: Secondary | ICD-10-CM | POA: Insufficient documentation

## 2016-11-07 DIAGNOSIS — S199XXA Unspecified injury of neck, initial encounter: Secondary | ICD-10-CM | POA: Diagnosis present

## 2016-11-07 DIAGNOSIS — Y9389 Activity, other specified: Secondary | ICD-10-CM | POA: Diagnosis not present

## 2016-11-07 LAB — POC URINE PREG, ED: Preg Test, Ur: NEGATIVE

## 2016-11-07 MED ORDER — IBUPROFEN 800 MG PO TABS: 800.0000 mg | ORAL_TABLET | Freq: Three times a day (TID) | ORAL | 0 refills | Status: DC

## 2016-11-07 MED ORDER — ACETAMINOPHEN 325 MG PO TABS
650.0000 mg | ORAL_TABLET | Freq: Once | ORAL | Status: AC
  Administered 2016-11-07: 650 mg via ORAL
  Filled 2016-11-07: qty 2

## 2016-11-07 MED ORDER — CYCLOBENZAPRINE HCL 10 MG PO TABS: 5.0000 mg | ORAL_TABLET | Freq: Two times a day (BID) | ORAL | 0 refills | Status: DC | PRN

## 2016-11-07 NOTE — ED Notes (Signed)
Patient transported to CT 
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
## 2016-12-30 ENCOUNTER — Ambulatory Visit: Payer: BLUE CROSS/BLUE SHIELD | Admitting: Adult Health

## 2017-03-05 ENCOUNTER — Inpatient Hospital Stay (HOSPITAL_COMMUNITY)
Admission: AD | Admit: 2017-03-05 | Discharge: 2017-03-05 | Disposition: A | Discharge status: Home / Self Care | Payer: BLUE CROSS/BLUE SHIELD | Source: Ambulatory Visit | Attending: Obstetrics & Gynecology | Admitting: Obstetrics & Gynecology

## 2017-03-05 ENCOUNTER — Encounter (HOSPITAL_COMMUNITY): Payer: Self-pay | Admitting: *Deleted

## 2017-03-05 ENCOUNTER — Inpatient Hospital Stay (HOSPITAL_COMMUNITY): Payer: BLUE CROSS/BLUE SHIELD

## 2017-03-05 DIAGNOSIS — Z801 Family history of malignant neoplasm of trachea, bronchus and lung: Secondary | ICD-10-CM | POA: Diagnosis not present

## 2017-03-05 DIAGNOSIS — Z8249 Family history of ischemic heart disease and other diseases of the circulatory system: Secondary | ICD-10-CM | POA: Insufficient documentation

## 2017-03-05 DIAGNOSIS — Z8041 Family history of malignant neoplasm of ovary: Secondary | ICD-10-CM | POA: Insufficient documentation

## 2017-03-05 DIAGNOSIS — O209 Hemorrhage in early pregnancy, unspecified: Secondary | ICD-10-CM | POA: Insufficient documentation

## 2017-03-05 DIAGNOSIS — Z3A08 8 weeks gestation of pregnancy: Secondary | ICD-10-CM | POA: Insufficient documentation

## 2017-03-05 DIAGNOSIS — Z8261 Family history of arthritis: Secondary | ICD-10-CM | POA: Diagnosis not present

## 2017-03-05 DIAGNOSIS — Z79899 Other long term (current) drug therapy: Secondary | ICD-10-CM | POA: Diagnosis not present

## 2017-03-05 DIAGNOSIS — O208 Other hemorrhage in early pregnancy: Secondary | ICD-10-CM

## 2017-03-05 LAB — CBC
HCT: 35 % — ABNORMAL LOW (ref 36.0–46.0)
Hemoglobin: 12.1 g/dL (ref 12.0–15.0)
MCH: 30.8 pg (ref 26.0–34.0)
MCHC: 34.6 g/dL (ref 30.0–36.0)
MCV: 89.1 fL (ref 78.0–100.0)
PLATELETS: 295 10*3/uL (ref 150–400)
RBC: 3.93 MIL/uL (ref 3.87–5.11)
RDW: 11.9 % (ref 11.5–15.5)
WBC: 5.6 10*3/uL (ref 4.0–10.5)

## 2017-03-05 LAB — HCG, QUANTITATIVE, PREGNANCY: HCG, BETA CHAIN, QUANT, S: 293989 m[IU]/mL — AB (ref <5)

## 2017-03-05 LAB — RH IG WORKUP (INCLUDES ABO/RH)
ABO/RH(D): O POS
Gestational Age(Wks): 8

## 2017-03-05 LAB — ABO/RH: ABO/RH(D): O POS

## 2017-03-05 NOTE — MAU Note (Signed)
Pt presents to MAU with complaints of vaginal bleeding after intercourse this am with vaginal soreness. Denies any vaginal bleeding at present

## 2017-03-05 NOTE — Discharge Instructions (Signed)
Vaginal Bleeding During Pregnancy, First Trimester °A small amount of bleeding (spotting) from the vagina is common in early pregnancy. Sometimes the bleeding is normal and is not a problem, and sometimes it is a sign of something serious. Be sure to tell your doctor about any bleeding from your vagina right away. °Follow these instructions at home: °· Watch your condition for any changes. °· Follow your doctor's instructions about how active you can be. °· If you are on bed rest: °¨ You may need to stay in bed and only get up to use the bathroom. °¨ You may be allowed to do some activities. °¨ If you need help, make plans for someone to help you. °· Write down: °¨ The number of pads you use each day. °¨ How often you change pads. °¨ How soaked (saturated) your pads are. °· Do not use tampons. °· Do not douche. °· Do not have sex or orgasms until your doctor says it is okay. °· If you pass any tissue from your vagina, save the tissue so you can show it to your doctor. °· Only take medicines as told by your doctor. °· Do not take aspirin because it can make you bleed. °· Keep all follow-up visits as told by your doctor. °Contact a doctor if: °· You bleed from your vagina. °· You have cramps. °· You have labor pains. °· You have a fever that does not go away after you take medicine. °Get help right away if: °· You have very bad cramps in your back or belly (abdomen). °· You pass large clots or tissue from your vagina. °· You bleed more. °· You feel light-headed or weak. °· You pass out (faint). °· You have chills. °· You are leaking fluid or have a gush of fluid from your vagina. °· You pass out while pooping (having a bowel movement). °This information is not intended to replace advice given to you by your health care provider. Make sure you discuss any questions you have with your health care provider. °Document Released: 03/05/2014 Document Revised: 03/26/2016 Document Reviewed: 06/26/2013 °Elsevier Interactive  Patient Education © 2017 [employer] ° °

## 2017-03-05 NOTE — MAU Provider Note (Signed)
History   21 yo G1P0 at 8 3/7 weeks presented after calling office to report bleeding after IC during night.  Reports mild cramping then, none now--"just feel weird down there".  Had NOB interview at [facility] on 5/2, with prenatal labs done then, not resulted yet.  NOB w/u scheduled with me 03/17/17.   Patient Active Problem List   Diagnosis Date Noted  . Vaginal bleeding affecting early pregnancy 03/05/2017    Chief Complaint  Patient presents with  . Vaginal Bleeding  . vaginal soreness   HPI:  As above  OB History    Gravida Para Term Preterm AB Living   1             SAB TAB Ectopic Multiple Live Births                  Past Medical History:  Diagnosis Date  . Polycystic ovarian syndrome around 2015    Past Surgical History:  Procedure Laterality Date  . NO PAST SURGERIES      Family History  Problem Relation Age of Onset  . Varicose Veins Mother   . Varicose Veins Maternal Grandmother   . Lupus Paternal Grandmother   . Arthritis Paternal Grandmother   . Ovarian cancer Paternal Grandmother   . Varicose Veins Paternal Grandmother   . Lung cancer Paternal Grandfather     Social History  Substance Use Topics  . Smoking status: Never Smoker  . Smokeless tobacco: Never Used  . Alcohol use No     Comment: stopped using since pregnancy    Allergies: No Known Allergies  Prescriptions Prior to Admission  Medication Sig Dispense Refill Last Dose  . acetaminophen (TYLENOL) 325 MG tablet Take 650 mg by mouth every 6 (six) hours as needed for moderate pain.   Past Month at Unknown time  . ibuprofen (ADVIL,MOTRIN) 800 MG tablet Take 1 tablet (800 mg total) by mouth 3 (three) times daily. 21 tablet 0 Past Month at Unknown time  . cyclobenzaprine (FLEXERIL) 10 MG tablet Take 0.5-1 tablets (5-10 mg total) by mouth 2 (two) times daily as needed. (Patient not taking: Reported on 03/05/2017) 20 tablet 0 Not Taking at Unknown time    ROS:  Vaginal bleeding during early  am Physical Exam   Blood pressure 95/62, pulse 77, temperature 98.2 F (36.8 C), resp. rate 18, height 5\' 2"  (1.575 m), weight 57.6 kg (127 lb), last menstrual period 01/05/2017.    Physical Exam  In NAD Chest clear Heart RRR without murmur Abd soft, NT Pelvic--no blood in vault, cervix slightly friable, closed, firm. No d/c in vault. Ext WNL  Results for orders placed or performed during the hospital encounter of 03/05/17 (from the past 24 hour(s))  CBC     Status: Abnormal   Collection Time: 03/05/17  1:05 PM  Result Value Ref Range   WBC 5.6 4.0 - 10.5 K/uL   RBC 3.93 3.87 - 5.11 MIL/uL   Hemoglobin 12.1 12.0 - 15.0 g/dL   HCT 16.1 (L) 09.6 - 04.5 %   MCV 89.1 78.0 - 100.0 fL   MCH 30.8 26.0 - 34.0 pg   MCHC 34.6 30.0 - 36.0 g/dL   RDW 40.9 81.1 - 91.4 %   Platelets 295 150 - 400 K/uL  hCG, quantitative, pregnancy     Status: Abnormal   Collection Time: 03/05/17  1:05 PM  Result Value Ref Range   hCG, Beta Chain, Quant, S 293,989 (H) <5 mIU/mL  Rh  IG workup (includes ABO/Rh)     Status: None   Collection Time: 03/05/17  1:05 PM  Result Value Ref Range   Gestational Age(Wks) 8    ABO/RH(D) O POS    No rh immune globuloin NOT A RH IMMUNE GLOBULIN CANDIDATE, PT RH POSITIVE     US: CRL: 19 mm 8 w 3 d -which matches clinical dates US EDC: 10/12/2017  Subchorionic hemorrhage:  None visualized.  Maternal uterus/adnexae: Normal appearance of the ovaries. No pelvic fluid.  IMPRESSION: Single living intrauterine pregnancy measuring 8 weeks 3 days. No adverse finding including subchorionic hematoma.    ED Course  Assessment: First trimester bleeding Viable pregnancy Rh positive type.   Plan: D/c home with bleeding precautions, on pelvic rest. Keep scheduled NOB w/u on 5/16, call prn.  Nigel BridgemanLATHAM, Jeran Hiltz CNM, MSN 03/05/2017 4:19 PM

## 2017-03-06 LAB — PROGESTERONE: Progesterone: 32 ng/mL

## 2017-03-18 ENCOUNTER — Encounter (HOSPITAL_COMMUNITY): Payer: Self-pay

## 2017-03-18 ENCOUNTER — Inpatient Hospital Stay (HOSPITAL_COMMUNITY)
Admission: AD | Admit: 2017-03-18 | Discharge: 2017-03-18 | Disposition: A | Discharge status: Home / Self Care | Payer: BLUE CROSS/BLUE SHIELD | Source: Ambulatory Visit | Attending: Obstetrics and Gynecology | Admitting: Obstetrics and Gynecology

## 2017-03-18 ENCOUNTER — Inpatient Hospital Stay (HOSPITAL_COMMUNITY): Payer: BLUE CROSS/BLUE SHIELD

## 2017-03-18 DIAGNOSIS — O208 Other hemorrhage in early pregnancy: Secondary | ICD-10-CM

## 2017-03-18 DIAGNOSIS — N939 Abnormal uterine and vaginal bleeding, unspecified: Secondary | ICD-10-CM

## 2017-03-18 DIAGNOSIS — Z3A10 10 weeks gestation of pregnancy: Secondary | ICD-10-CM | POA: Insufficient documentation

## 2017-03-18 DIAGNOSIS — O209 Hemorrhage in early pregnancy, unspecified: Secondary | ICD-10-CM | POA: Insufficient documentation

## 2017-03-18 LAB — URINALYSIS, ROUTINE W REFLEX MICROSCOPIC
Bilirubin Urine: NEGATIVE
GLUCOSE, UA: NEGATIVE mg/dL
Hgb urine dipstick: NEGATIVE
Ketones, ur: NEGATIVE mg/dL
Leukocytes, UA: NEGATIVE
Nitrite: NEGATIVE
Protein, ur: NEGATIVE mg/dL
Specific Gravity, Urine: 1.025 (ref 1.005–1.030)
pH: 6 (ref 5.0–8.0)

## 2017-03-18 LAB — WET PREP, GENITAL
SPERM: NONE SEEN
TRICH WET PREP: NONE SEEN
Yeast Wet Prep HPF POC: NONE SEEN

## 2017-03-18 LAB — HCG, QUANTITATIVE, PREGNANCY: hCG, Beta Chain, Quant, S: 153437 m[IU]/mL — ABNORMAL HIGH (ref <5)

## 2017-03-18 IMAGING — US US OB COMP LESS 14 WK
1 series · 15 of 28 positions shown · non-contrast
Comparison: Prior ultrasound from [DATE].

CLINICAL DATA: Initial evaluation for vaginal spotting for 1 day.
Beta hCGs contain.

EXAM:
OBSTETRIC <14 WK US AND TRANSVAGINAL OB US
TECHNIQUE: Both transabdominal and transvaginal ultrasound examinations were
performed for complete evaluation of the gestation as well as the
maternal uterus, adnexal regions, and pelvic cul-de-sac.
Transvaginal technique was performed to assess early pregnancy.

[Series 1: us ob comp less 14 wk · 15 of 40 slices shown]
[im 1/40]
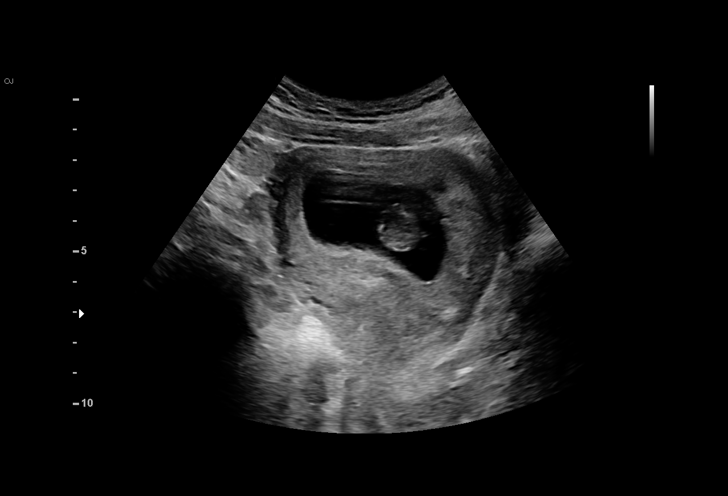
[im 3/40]
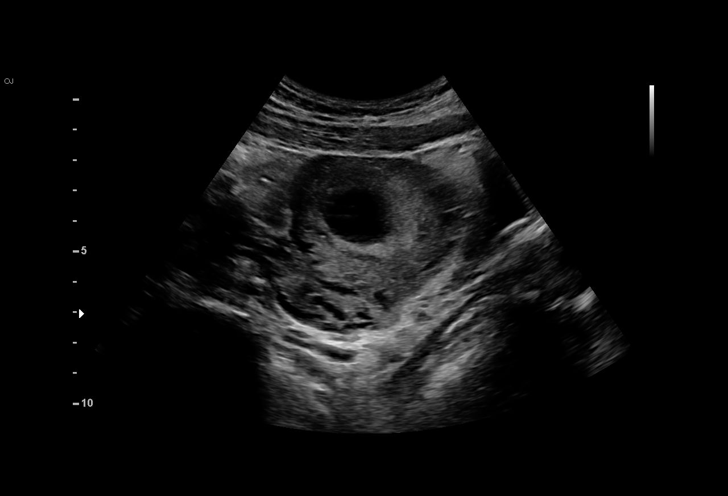
[im 6/40]
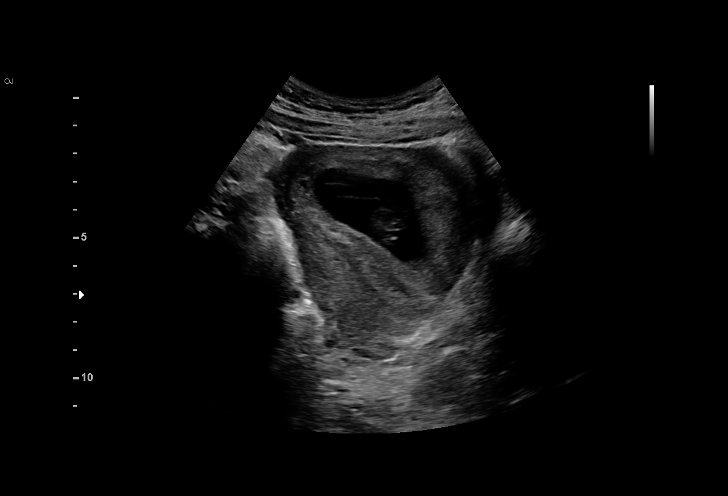
[im 9/40]
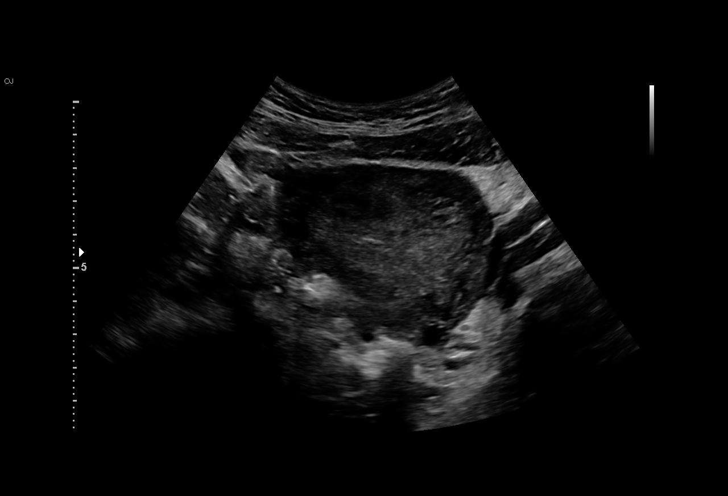
[im 12/40]
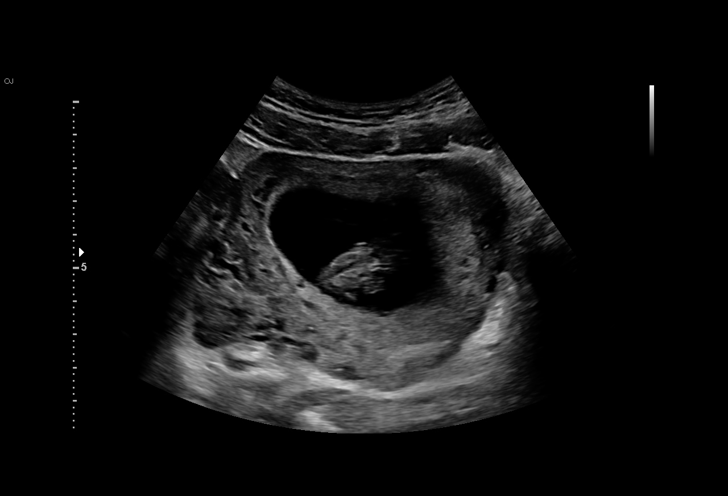
[im 15/40]
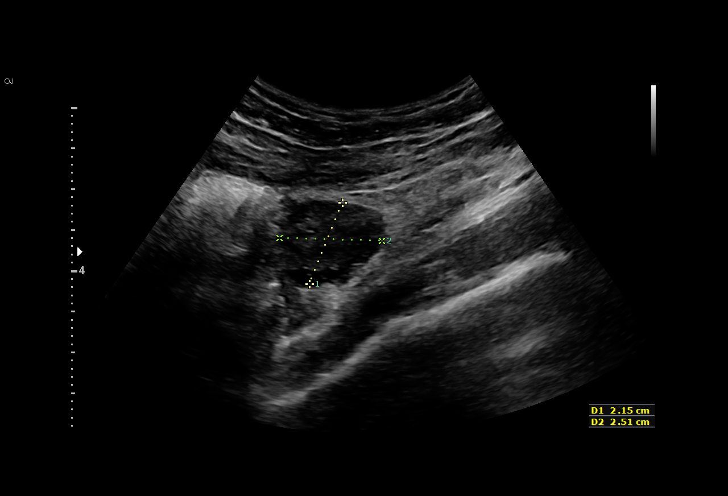
[im 18/40]
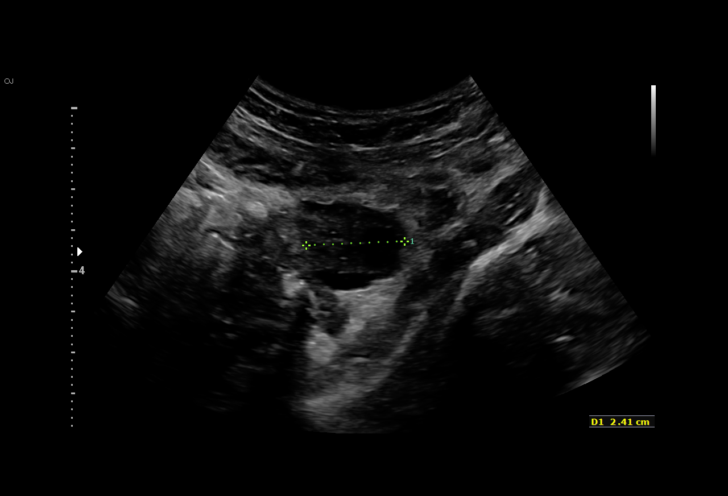
[im 21/40]
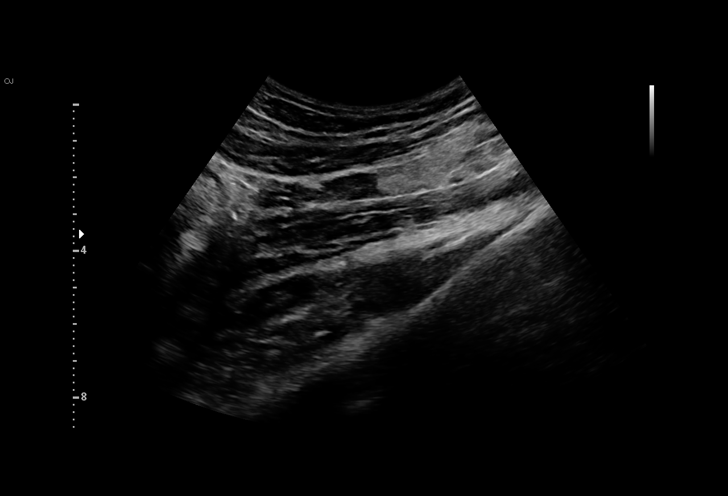
[im 22/40]
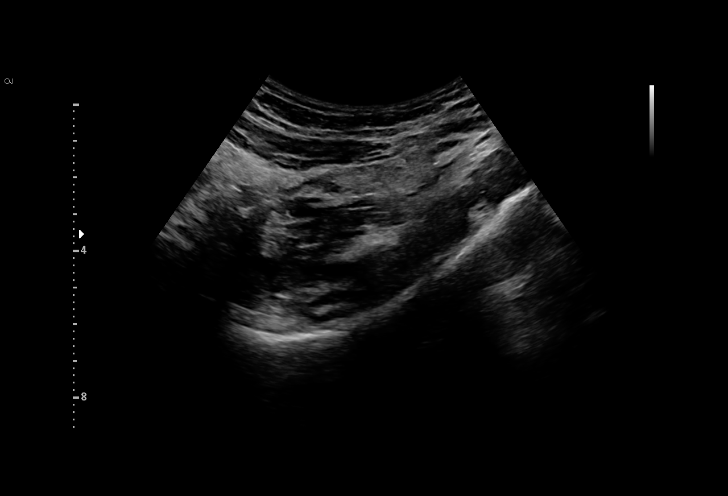
[im 25/40]
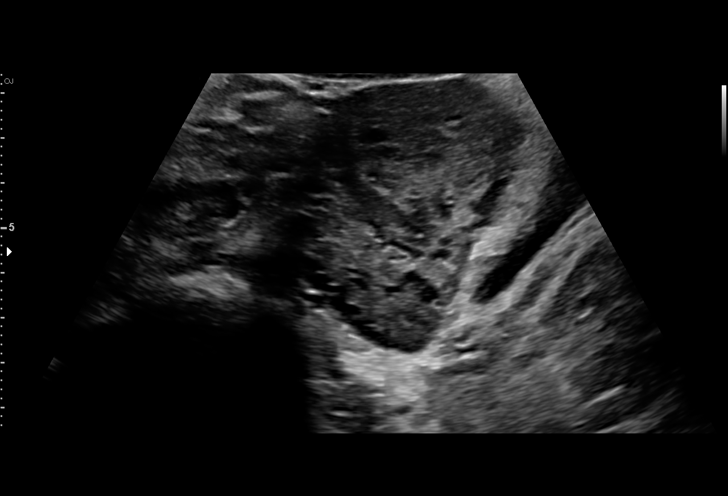
[im 28/40]
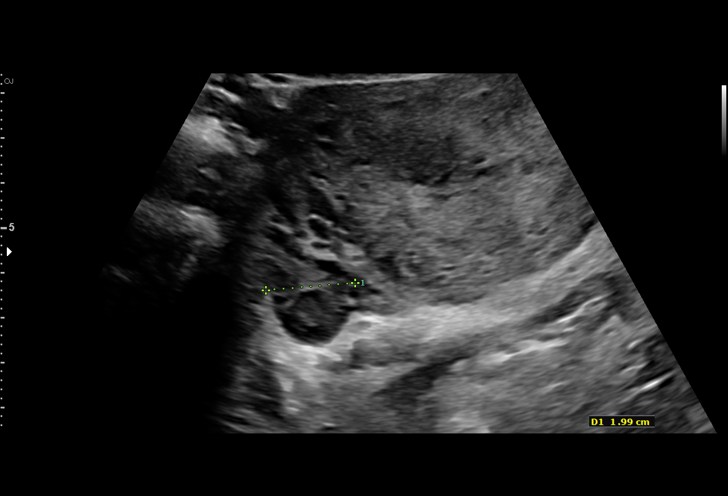
[im 31/40]
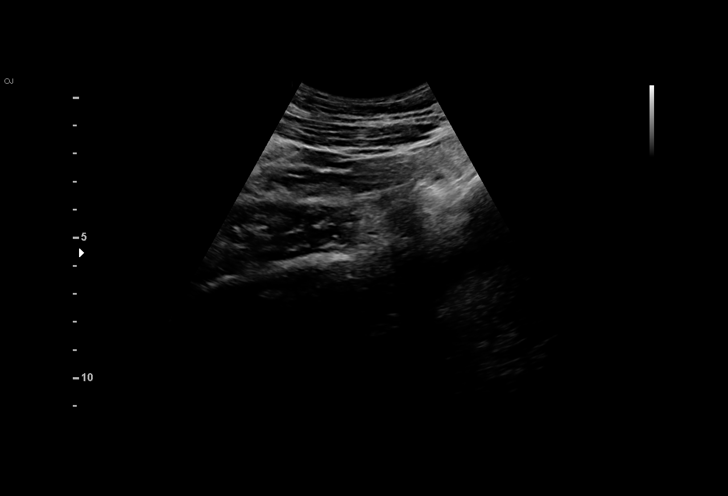
[im 34/40]
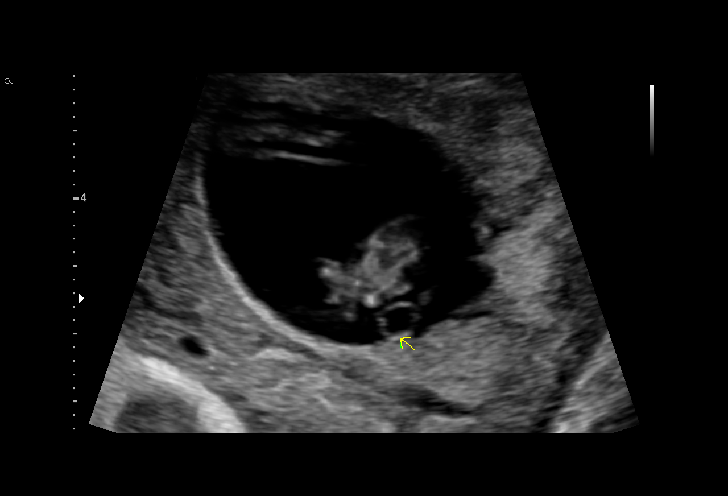
[im 37/40]
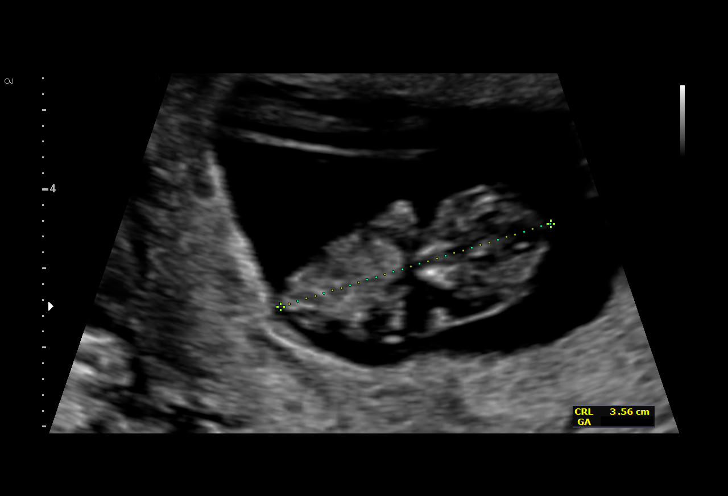
[im 40/40]
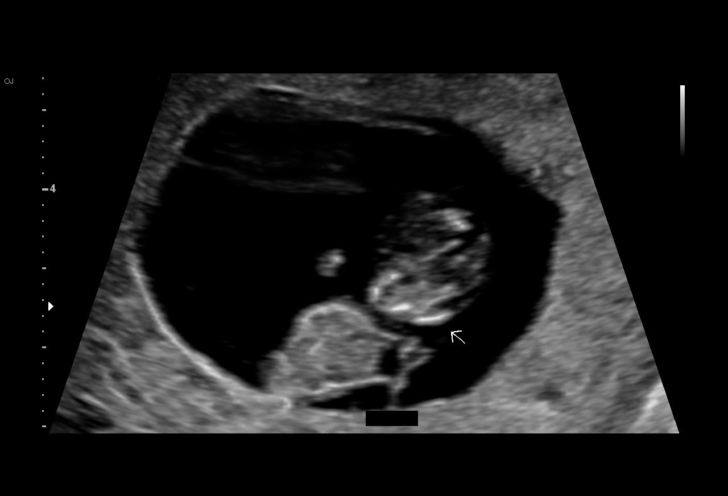

[15 of 28 positions shown; findings below may reference images not displayed]

FINDINGS: Intrauterine gestational sac: Single

Yolk sac:  Present

Embryo:  Present

Cardiac Activity: Present

Heart Rate: 178  bpm

CRL:  34  mm   10 w   2 d                  US EDC: [DATE]

Subchorionic hemorrhage:  None visualized.

Maternal uterus/adnexae: Sonographic evaluation of the adnexa was
unremarkable. Ovaries are normal in appearance. No adnexal mass. No
free fluid.
IMPRESSION: Single live intrauterine pregnancy measuring 10 weeks and 2 days. No
complication or adverse features identified.

## 2017-03-18 MED ORDER — METRONIDAZOLE 0.75 % VA GEL: 1.0000 | Freq: Every day | VAGINAL | 0 refills | Status: DC

## 2017-03-18 NOTE — MAU Note (Signed)
Patient presents with c/o vaginal spotting that started yesterday.

## 2017-03-18 NOTE — Discharge Instructions (Signed)
Bacterial Vaginosis °Bacterial vaginosis is a vaginal infection that occurs when the normal balance of bacteria in the vagina is disrupted. It results from an overgrowth of certain bacteria. This is the most common vaginal infection among women ages 15-44. °Because bacterial vaginosis increases your risk for STIs (sexually transmitted infections), getting treated can help reduce your risk for chlamydia, gonorrhea, herpes, and HIV (human immunodeficiency virus). Treatment is also important for preventing complications in pregnant women, because this condition can cause an early (premature) delivery. °What are the causes? °This condition is caused by an increase in harmful bacteria that are normally present in small amounts in the vagina. However, the reason that the condition develops is not fully understood. °What increases the risk? °The following factors may make you more likely to develop this condition: °· Having a new sexual partner or multiple sexual partners. °· Having unprotected sex. °· Douching. °· Having an intrauterine device (IUD). °· Smoking. °· Drug and alcohol abuse. °· Taking certain antibiotic medicines. °· Being pregnant. °You cannot get bacterial vaginosis from toilet seats, bedding, swimming pools, or contact with objects around you. °What are the signs or symptoms? °Symptoms of this condition include: °· Grey or white vaginal discharge. The discharge can also be watery or foamy. °· A fish-like odor with discharge, especially after sexual intercourse or during menstruation. °· Itching in and around the vagina. °· Burning or pain with urination. °Some women with bacterial vaginosis have no signs or symptoms. °How is this diagnosed? °This condition is diagnosed based on: °· Your medical history. °· A physical exam of the vagina. °· Testing a sample of vaginal fluid under a microscope to look for a large amount of bad bacteria or abnormal cells. Your health care provider may use a cotton swab or a  small wooden spatula to collect the sample. °How is this treated? °This condition is treated with antibiotics. These may be given as a pill, a vaginal cream, or a medicine that is put into the vagina (suppository). If the condition comes back after treatment, a second round of antibiotics may be needed. °Follow these instructions at home: °Medicines  °· Take over-the-counter and prescription medicines only as told by your health care provider. °· Take or use your antibiotic as told by your health care provider. Do not stop taking or using the antibiotic even if you start to feel better. °General instructions  °· If you have a female sexual partner, tell her that you have a vaginal infection. She should see her health care provider and be treated if she has symptoms. If you have a female sexual partner, he does not need treatment. °· During treatment: °¨ Avoid sexual activity until you finish treatment. °¨ Do not douche. °¨ Avoid alcohol as directed by your health care provider. °¨ Avoid breastfeeding as directed by your health care provider. °· Drink enough water and fluids to keep your urine clear or pale yellow. °· Keep the area around your vagina and rectum clean. °¨ Wash the area daily with warm water. °¨ Wipe yourself from front to back after using the toilet. °· Keep all follow-up visits as told by your health care provider. This is important. °How is this prevented? °· Do not douche. °· Wash the outside of your vagina with warm water only. °· Use protection when having sex. This includes latex condoms and dental dams. °· Limit how many sexual partners you have. To help prevent bacterial vaginosis, it is best to have sex with just one   partner (monogamous). °· Make sure you and your sexual partner are tested for STIs. °· Wear cotton or cotton-lined underwear. °· Avoid wearing tight pants and pantyhose, especially during summer. °· Limit the amount of alcohol that you drink. °· Do not use any products that contain  nicotine or tobacco, such as cigarettes and e-cigarettes. If you need help quitting, ask your health care provider. °· Do not use illegal drugs. °Where to find more information: °· Centers for Disease Control and Prevention: www.cdc.gov/std °· American Sexual Health Association (ASHA): www.ashastd.org °· U.[place] Department of Health and Human Services, Office on Women's Health: www.womenshealth.gov/ or https://www.womenshealth.gov/a-z-topics/bacterial-vaginosis °Contact a health care provider if: °· Your symptoms do not improve, even after treatment. °· You have more discharge or pain when urinating. °· You have a fever. °· You have pain in your abdomen. °· You have pain during sex. °· You have vaginal bleeding between periods. °Summary °· Bacterial vaginosis is a vaginal infection that occurs when the normal balance of bacteria in the vagina is disrupted. °· Because bacterial vaginosis increases your risk for STIs (sexually transmitted infections), getting treated can help reduce your risk for chlamydia, gonorrhea, herpes, and HIV (human immunodeficiency virus). Treatment is also important for preventing complications in pregnant women, because the condition can cause an early (premature) delivery. °· This condition is treated with antibiotic medicines. These may be given as a pill, a vaginal cream, or a medicine that is put into the vagina (suppository). °This information is not intended to replace advice given to you by your health care provider. Make sure you discuss any questions you have with your health care provider. °Document Released: 10/19/2005 Document Revised: 07/04/2016 Document Reviewed: 07/04/2016 °Elsevier Interactive Patient Education © 2017 [employer] ° °First Trimester of Pregnancy °The first trimester of pregnancy is from week 1 until the end of week 13 (months 1 through 3). A week after a sperm fertilizes an egg, the egg will implant on the wall of the uterus. This embryo will begin to develop  into a baby. Genes from you and your partner will form the baby. The female genes will determine whether the baby will be a boy or a girl. At 6-8 weeks, the eyes and face will be formed, and the heartbeat can be seen on ultrasound. At the end of 12 weeks, all the baby's organs will be formed. °Now that you are pregnant, you will want to do everything you can to have a healthy baby. Two of the most important things are to get good prenatal care and to follow your health care provider's instructions. Prenatal care is all the medical care you receive before the baby's birth. This care will help prevent, find, and treat any problems during the pregnancy and childbirth. °Body changes during your first trimester °Your body goes through many changes during pregnancy. The changes vary from woman to woman. °· You may gain or lose a couple of pounds at first. °· You may feel sick to your stomach (nauseous) and you may throw up (vomit). If the vomiting is uncontrollable, call your health care provider. °· You may tire easily. °· You may develop headaches that can be relieved by medicines. All medicines should be approved by your health care provider. °· You may urinate more often. Painful urination may mean you have a bladder infection. °· You may develop heartburn as a result of your pregnancy. °· You may develop constipation because certain hormones are causing the muscles that push stool through your intestines   to slow down. °· You may develop hemorrhoids or swollen veins (varicose veins). °· Your breasts may begin to grow larger and become tender. Your nipples may stick out more, and the tissue that surrounds them (areola) may become darker. °· Your gums may bleed and may be sensitive to brushing and flossing. °· Dark spots or blotches (chloasma, mask of pregnancy) may develop on your face. This will likely fade after the baby is born. °· Your menstrual periods will stop. °· You may have a loss of appetite. °· You may  develop cravings for certain kinds of food. °· You may have changes in your emotions from day to day, such as being excited to be pregnant or being concerned that something may go wrong with the pregnancy and baby. °· You may have more vivid and strange dreams. °· You may have changes in your hair. These can include thickening of your hair, rapid growth, and changes in texture. Some women also have hair loss during or after pregnancy, or hair that feels dry or thin. Your hair will most likely return to normal after your baby is born. °What to expect at prenatal visits °During a routine prenatal visit: °· You will be weighed to make sure you and the baby are growing normally. °· Your blood pressure will be taken. °· Your abdomen will be measured to track your baby's growth. °· The fetal heartbeat will be listened to between weeks 10 and 14 of your pregnancy. °· Test results from any previous visits will be discussed. °Your health care provider may ask you: °· How you are feeling. °· If you are feeling the baby move. °· If you have had any abnormal symptoms, such as leaking fluid, bleeding, severe headaches, or abdominal cramping. °· If you are using any tobacco products, including cigarettes, chewing tobacco, and electronic cigarettes. °· If you have any questions. °Other tests that may be performed during your first trimester include: °· Blood tests to find your blood type and to check for the presence of any previous infections. The tests will also be used to check for low iron levels (anemia) and protein on red blood cells (Rh antibodies). Depending on your risk factors, or if you previously had diabetes during pregnancy, you may have tests to check for high blood sugar that affects pregnant women (gestational diabetes). °· Urine tests to check for infections, diabetes, or protein in the urine. °· An ultrasound to confirm the proper growth and development of the baby. °· Fetal screens for spinal cord problems  (spina bifida) and Down syndrome. °· HIV (human immunodeficiency virus) testing. Routine prenatal testing includes screening for HIV, unless you choose not to have this test. °· You may need other tests to make sure you and the baby are doing well. °Follow these instructions at home: °Medicines  °· Follow your health care provider's instructions regarding medicine use. Specific medicines may be either safe or unsafe to take during pregnancy. °· Take a prenatal vitamin that contains at least 600 micrograms (mcg) of folic acid. °· If you develop constipation, try taking a stool softener if your health care provider approves. °Eating and drinking  °· Eat a balanced diet that includes fresh fruits and vegetables, whole grains, good sources of protein such as meat, eggs, or tofu, and low-fat dairy. Your health care provider will help you determine the amount of weight gain that is right for you. °· Avoid raw meat and uncooked cheese. These carry germs that can cause birth defects   in the baby. °· Eating four or five small meals rather than three large meals a day may help relieve nausea and vomiting. If you start to feel nauseous, eating a few soda crackers can be helpful. Drinking liquids between meals, instead of during meals, also seems to help ease nausea and vomiting. °· Limit foods that are high in fat and processed sugars, such as fried and sweet foods. °· To prevent constipation: °¨ Eat foods that are high in fiber, such as fresh fruits and vegetables, whole grains, and beans. °¨ Drink enough fluid to keep your urine clear or pale yellow. °Activity  °· Exercise only as directed by your health care provider. Most women can continue their usual exercise routine during pregnancy. Try to exercise for 30 minutes at least 5 days a week. Exercising will help you: °¨ Control your weight. °¨ Stay in shape. °¨ Be prepared for labor and delivery. °· Experiencing pain or cramping in the lower abdomen or lower back is a good  sign that you should stop exercising. Check with your health care provider before continuing with normal exercises. °· Try to avoid standing for long periods of time. Move your legs often if you must stand in one place for a long time. °· Avoid heavy lifting. °· Wear low-heeled shoes and practice good posture. °· You may continue to have sex unless your health care provider tells you not to. °Relieving pain and discomfort  °· Wear a good support bra to relieve breast tenderness. °· Take warm sitz baths to soothe any pain or discomfort caused by hemorrhoids. Use hemorrhoid cream if your health care provider approves. °· Rest with your legs elevated if you have leg cramps or low back pain. °· If you develop varicose veins in your legs, wear support hose. Elevate your feet for 15 minutes, 3-4 times a day. Limit salt in your diet. °Prenatal care  °· Schedule your prenatal visits by the twelfth week of pregnancy. They are usually scheduled monthly at first, then more often in the last 2 months before delivery. °· Write down your questions. Take them to your prenatal visits. °· Keep all your prenatal visits as told by your health care provider. This is important. °Safety  °· Wear your seat belt at all times when driving. °· Make a list of emergency phone numbers, including numbers for family, friends, the hospital, and police and fire departments. °General instructions  °· Ask your health care provider for a referral to a local prenatal education class. Begin classes no later than the beginning of month 6 of your pregnancy. °· Ask for help if you have counseling or nutritional needs during pregnancy. Your health care provider can offer advice or refer you to specialists for help with various needs. °· Do not use hot tubs, steam rooms, or saunas. °· Do not douche or use tampons or scented sanitary pads. °· Do not cross your legs for long periods of time. °· Avoid cat litter boxes and soil used by cats. These carry germs  that can cause birth defects in the baby and possibly loss of the fetus by miscarriage or stillbirth. °· Avoid all smoking, herbs, alcohol, and medicines not prescribed by your health care provider. Chemicals in these products affect the formation and growth of the baby. °· Do not use any products that contain nicotine or tobacco, such as cigarettes and e-cigarettes. If you need help quitting, ask your health care provider. You may receive counseling support and other resources to   help you quit. °· Schedule a dentist appointment. At home, brush your teeth with a soft toothbrush and be gentle when you floss. °Contact a health care provider if: °· You have dizziness. °· You have mild pelvic cramps, pelvic pressure, or nagging pain in the abdominal area. °· You have persistent nausea, vomiting, or diarrhea. °· You have a bad smelling vaginal discharge. °· You have pain when you urinate. °· You notice increased swelling in your face, hands, legs, or ankles. °· You are exposed to fifth disease or chickenpox. °· You are exposed to German measles (rubella) and have never had it. °Get help right away if: °· You have a fever. °· You are leaking fluid from your vagina. °· You have spotting or bleeding from your vagina. °· You have severe abdominal cramping or pain. °· You have rapid weight gain or loss. °· You vomit blood or material that looks like coffee grounds. °· You develop a severe headache. °· You have shortness of breath. °· You have any kind of trauma, such as from a fall or a car accident. °Summary °· The first trimester of pregnancy is from week 1 until the end of week 13 (months 1 through 3). °· Your body goes through many changes during pregnancy. The changes vary from woman to woman. °· You will have routine prenatal visits. During those visits, your health care provider will examine you, discuss any test results you may have, and talk with you about how you are feeling. °This information is not intended to  replace advice given to you by your health care provider. Make sure you discuss any questions you have with your health care provider. °Document Released: 10/13/2001 Document Revised: 09/30/2016 Document Reviewed: 09/30/2016 °Elsevier Interactive Patient Education © 2017 [employer] Inc. ° °

## 2017-03-18 NOTE — [facility] Provider Note (Signed)
History    Renee Acosta is a 21y.o. G1P0 at 10.2ws by definite LMP.  Patient reports light pink spotting since Sunday that is intermittent in nature.  However today patient states that she woke up and passed a clot the size of a grape and bleeding was bright red. Patient also reports cramping that started today and is localized to the left side. Patient reports sexual activity on Monday.  Patient states vaginal area has been "sore and aching."  Patient reports constipation and nausea, but denies diarrhea and vomiting.    Patient Active Problem List   Diagnosis Date Noted  . Vaginal bleeding affecting early pregnancy 03/05/2017    Chief Complaint  Patient presents with  . Vaginal Bleeding   HPI  OB History    Gravida Para Term Preterm AB Living   1             SAB TAB Ectopic Multiple Live Births                  Past Medical History:  Diagnosis Date  . Polycystic ovarian syndrome around 2015    Past Surgical History:  Procedure Laterality Date  . NO PAST SURGERIES      Family History  Problem Relation Age of Onset  . Varicose Veins Mother   . Varicose Veins Maternal Grandmother   . Lupus Paternal Grandmother   . Arthritis Paternal Grandmother   . Ovarian cancer Paternal Grandmother   . Varicose Veins Paternal Grandmother   . Lung cancer Paternal Grandfather     Social History  Substance Use Topics  . Smoking status: Never Smoker  . Smokeless tobacco: Never Used  . Alcohol use No     Comment: stopped using since pregnancy    Allergies: No Known Allergies  Prescriptions Prior to Admission  Medication Sig Dispense Refill Last Dose  . Prenatal MV & Min w/FA-DHA (PRENATAL ADULT GUMMY/DHA/FA) 0.4-25 MG CHEW Chew 2 tablets by mouth daily.   03/18/2017 at Unknown time  . acetaminophen (TYLENOL) 325 MG tablet Take 650 mg by mouth every 6 (six) hours as needed for moderate pain.   prn  . cyclobenzaprine (FLEXERIL) 10 MG tablet Take 0.5-1 tablets (5-10 mg total) by  mouth 2 (two) times daily as needed. (Patient not taking: Reported on 03/05/2017) 20 tablet 0 Not Taking at Unknown time  . ibuprofen (ADVIL,MOTRIN) 800 MG tablet Take 1 tablet (800 mg total) by mouth 3 (three) times daily. 21 tablet 0 Past Month at Unknown time    ROS  See HPI Above Physical Exam   Blood pressure 108/64, pulse 83, temperature 98.3 F (36.8 C), resp. rate 16, last menstrual period 01/05/2017.  Results for orders placed or performed during the hospital encounter of 03/18/17 (from the past 24 hour(s))  Urinalysis, Routine w reflex microscopic     Status: None   Collection Time: 03/18/17  7:40 PM  Result Value Ref Range   Color, Urine YELLOW YELLOW   APPearance CLEAR CLEAR   Specific Gravity, Urine 1.025 1.005 - 1.030   pH 6.0 5.0 - 8.0   Glucose, UA NEGATIVE NEGATIVE mg/dL   Hgb urine dipstick NEGATIVE NEGATIVE   Bilirubin Urine NEGATIVE NEGATIVE   Ketones, ur NEGATIVE NEGATIVE mg/dL   Protein, ur NEGATIVE NEGATIVE mg/dL   Nitrite NEGATIVE NEGATIVE   Leukocytes, UA NEGATIVE NEGATIVE  hCG, quantitative, pregnancy     Status: Abnormal   Collection Time: 03/18/17  7:40 PM  Result Value Ref Range  hCG, Beta Chain, [place] Longest 657,846 (H) <5 mIU/mL  Wet prep, genital     Status: Abnormal   Collection Time: 03/18/17  8:41 PM  Result Value Ref Range   Yeast Wet Prep HPF POC NONE SEEN NONE SEEN   Trich, Wet Prep NONE SEEN NONE SEEN   Clue Cells Wet Prep HPF POC PRESENT (A) NONE SEEN   WBC, Wet Prep HPF POC MODERATE (A) NONE SEEN   Sperm NONE SEEN     Physical Exam  Constitutional: She is oriented to person, place, and time. She appears well-developed and well-nourished. No distress.  HENT:  Head: Normocephalic and atraumatic.  Eyes: Conjunctivae are normal.  Neck: Normal range of motion.  Cardiovascular: Normal rate, regular rhythm and normal heart sounds.   Respiratory: Effort normal and breath sounds normal.  GI: Soft. Bowel sounds are normal.  Genitourinary:  There is no rash, tenderness or lesion on the right labia. There is no rash, tenderness or lesion on the left labia. Uterus is enlarged. Uterus is not tender. Cervix exhibits discharge. Cervix exhibits no motion tenderness and no friability. No bleeding in the vagina. Vaginal discharge found.  Genitourinary Comments: Sterile Speculum Exam: -Vaginal Vault: Moderate amt thin pinkish white discharge -wet prep collected -Cervix:Appears closed, thin mucoid discharge from os-GC/CT collected -Bimanual Exam: Closed.  Pain ilicited on left side-adnexa not palpated. Uterus appropriate size.  Musculoskeletal: Normal range of motion. She exhibits no edema.  Neurological: She is oriented to person, place, and time.  Skin: Skin is warm and dry.    COMPARISON:  Prior ultrasound from 03/05/2017.  FINDINGS: Intrauterine gestational sac: Single  Yolk sac:  Present  Embryo:  Present  Cardiac Activity: Present  Heart Rate: 178  bpm  CRL:  34  mm   10 w   2 d                  [place] EDC: 10/12/2017  Subchorionic hemorrhage:  None visualized.  Maternal uterus/adnexae: Sonographic evaluation of the adnexa was unremarkable. Ovaries are normal in appearance. No adnexal mass. No free fluid.  IMPRESSION: Single live intrauterine pregnancy measuring 10 weeks and 2 days. No complication or adverse features identified.   ED Course  Assessment: IUP at 10.2wks Vaginal Bleeding  Plan: -Labs: bHcG, Urine, Wet Prep, and GC/CT -PE as above -OB US for fetal well being  Follow Up (2120) -+Clue Cells-Bacterial Vaginosis -Rx for metrogel sent to pharmacy on file-Verbal instructions given to patient -Discussed ways to prevent bacterial vaginosis:  +Wear cotton underwear +Use low scent or scent free soaps and detergents +No douching -Instructed on need for pelvic rest until after treatment and no bleeding noted -Instructed to call if bleeding increases or continues after treatment complete -Keep  appt as scheduled: 6/11 -Encouraged to call if any questions or concerns arise prior to next scheduled office visit.  -Discharged to home in stable condition  Cherre Robins CNM, MSN 03/18/2017 8:13 PM

## 2017-03-19 LAB — GC/CHLAMYDIA PROBE AMP (~~LOC~~) NOT AT ARMC
Chlamydia: NEGATIVE
NEISSERIA GONORRHEA: NEGATIVE

## 2017-10-16 ENCOUNTER — Inpatient Hospital Stay (HOSPITAL_COMMUNITY)
Admission: AD | Admit: 2017-10-16 | Discharge: 2017-10-19 | DRG: 807 | Disposition: A | Discharge status: Home / Self Care | Payer: Medicaid Other | Source: Ambulatory Visit | Attending: Obstetrics and Gynecology | Admitting: Obstetrics and Gynecology

## 2017-10-16 ENCOUNTER — Other Ambulatory Visit: Payer: Self-pay

## 2017-10-16 ENCOUNTER — Encounter (HOSPITAL_COMMUNITY): Payer: Self-pay | Admitting: *Deleted

## 2017-10-16 DIAGNOSIS — Z3A40 40 weeks gestation of pregnancy: Secondary | ICD-10-CM

## 2017-10-16 NOTE — H&P (Signed)
Renee Acosta is a 21 y.o. female presenting for SROM at 2200.  Patient reports contractions started shortly after SROM and fluid was noted to be clear.  Patient is under the care of [facility] and pregnancy and medical history has been unremarkable.  Patient desires an unmedicated, low intervention birth.  Patient is anticipating a female child for outpatient circumcision.   OB History    Gravida Para Term Preterm AB Living   1             SAB TAB Ectopic Multiple Live Births                 Past Medical History:  Diagnosis Date  . Polycystic ovarian syndrome around 2015   Past Surgical History:  Procedure Laterality Date  . NO PAST SURGERIES     Family History: family history includes Arthritis in her paternal grandmother; Lung cancer in her paternal grandfather; Lupus in her paternal grandmother; Ovarian cancer in her paternal grandmother; Varicose Veins in her maternal grandmother, mother, and paternal grandmother. Social History:  reports that  has never smoked. she has never used smokeless tobacco. She reports that she uses drugs. Drug: Marijuana. She reports that she does not drink alcohol.     Maternal Diabetes: No Genetic Screening: Normal Maternal Ultrasounds/Referrals: Normal Fetal Ultrasounds or other Referrals:  None Maternal Substance Abuse:  No Significant Maternal Medications:  None Significant Maternal Lab Results:  Lab values include: Group B Strep negative Other Comments:  None  Review of Systems  Gastrointestinal: Positive for nausea and vomiting.  All other systems reviewed and are negative.  Maternal Medical History:  Reason for admission: Rupture of membranes, contractions and nausea.   Contractions: Onset was less than 1 hour ago.   Frequency: regular.   Perceived severity is moderate.    Fetal activity: Perceived fetal activity is normal.   Last perceived fetal movement was within the past hour.    Prenatal complications: no prenatal  complications Prenatal Complications - Diabetes: none.    Dilation: 3.5 Effacement (%): 80 Station: -2 Exam by:: B Mosca RN Blood pressure 104/67, pulse 81, temperature 98.6 F (37 C), resp. rate 18, height 5' (1.524 m), weight 68.5 kg (151 lb), last menstrual period 01/05/2017. Maternal Exam:  Uterine Assessment: Contraction strength is moderate.  Contraction frequency is regular.   Abdomen: Patient reports no abdominal tenderness. Fundal height is AGA.   Estimated fetal weight is 7lbs.   Fetal presentation: vertex  Introitus: Ferning test: positive.  Amniotic fluid character: clear. By provider  Cervix: Cervix evaluated by digital exam.     Fetal Exam Fetal Monitor Review: Baseline rate: 135.  Variability: moderate (6-25 bpm).   Pattern: no accelerations and early decelerations.    Fetal State Assessment: Category I - tracings are normal.     Physical Exam  Constitutional: She is oriented to person, place, and time. She appears well-developed and well-nourished.  HENT:  Head: Normocephalic and atraumatic.  Eyes: Conjunctivae are normal.  Neck: Normal range of motion.  Cardiovascular: Normal rate, regular rhythm and normal heart sounds.  Respiratory: Effort normal and breath sounds normal.  GI: Soft. Bowel sounds are normal.  Musculoskeletal: Normal range of motion. She exhibits no edema.  Neurological: She is alert and oriented to person, place, and time.  Skin: Skin is warm and dry.  Psychiatric: She has a normal mood and affect. Her behavior is normal.    Prenatal labs: ABO, Rh: --/--/O POS, O POS (05/04 1305) Antibody:  NEG (12/16 0018) Rubella:  Immune RPR:   NR HBsAg:  NR  HIV:   NR GBS:   Negative  Assessment/Plan: IUP at 40.5wks Cat I FT Active Labor SROM GBS Negative  [facility] with Dr. Lance MorinA. Roberts Routine Labor and Delivery Orders per [facility] Protocol In room to complete assessment and discuss POC: Okay for  intermittent monitoring as appropriate Patient with questionable prolonged deceleration that resolved with interventions Patient encouraged to rest Anticipate SVD Dr.AR to be updated as appropriate  Cherre RobinsJessica L Mataeo Ingwersen  MSN, CNM 10/16/2017, 11:55 PM

## 2017-10-17 ENCOUNTER — Other Ambulatory Visit: Payer: Self-pay

## 2017-10-17 ENCOUNTER — Encounter (HOSPITAL_COMMUNITY): Payer: Self-pay | Admitting: *Deleted

## 2017-10-17 DIAGNOSIS — Z3A40 40 weeks gestation of pregnancy: Secondary | ICD-10-CM | POA: Diagnosis not present

## 2017-10-17 DIAGNOSIS — Z3483 Encounter for supervision of other normal pregnancy, third trimester: Secondary | ICD-10-CM | POA: Diagnosis present

## 2017-10-17 LAB — CBC
HCT: 32.6 % — ABNORMAL LOW (ref 36.0–46.0)
Hemoglobin: 10.4 g/dL — ABNORMAL LOW (ref 12.0–15.0)
MCH: 28.3 pg (ref 26.0–34.0)
MCHC: 31.9 g/dL (ref 30.0–36.0)
MCV: 88.6 fL (ref 78.0–100.0)
Platelets: 329 10*3/uL (ref 150–400)
RBC: 3.68 MIL/uL — ABNORMAL LOW (ref 3.87–5.11)
RDW: 13.1 % (ref 11.5–15.5)
WBC: 7.3 10*3/uL (ref 4.0–10.5)

## 2017-10-17 LAB — POCT FERN TEST: POCT FERN TEST: POSITIVE

## 2017-10-17 LAB — TYPE AND SCREEN
ABO/RH(D): O POS
ANTIBODY SCREEN: NEGATIVE

## 2017-10-17 LAB — RPR: RPR Ser Ql: NONREACTIVE

## 2017-10-17 MED ORDER — IBUPROFEN 600 MG PO TABS
600.0000 mg | ORAL_TABLET | Freq: Four times a day (QID) | ORAL | Status: DC
  Administered 2017-10-17 – 2017-10-19 (×7): 600 mg via ORAL
  Filled 2017-10-17 (×8): qty 1

## 2017-10-17 MED ORDER — ONDANSETRON HCL 4 MG/2ML IJ SOLN: 4.0000 mg | INTRAMUSCULAR | Status: DC | PRN

## 2017-10-17 MED ORDER — ZOLPIDEM TARTRATE 5 MG PO TABS: 5.0000 mg | ORAL_TABLET | Freq: Every evening | ORAL | Status: DC | PRN

## 2017-10-17 MED ORDER — PRENATAL MULTIVITAMIN CH
1.0000 | ORAL_TABLET | Freq: Every day | ORAL | Status: DC
  Administered 2017-10-17 – 2017-10-19 (×3): 1 via ORAL
  Filled 2017-10-17 (×3): qty 1

## 2017-10-17 MED ORDER — BENZOCAINE-MENTHOL 20-0.5 % EX AERO
1.0000 "application " | INHALATION_SPRAY | CUTANEOUS | Status: DC | PRN
  Administered 2017-10-17: 1 via TOPICAL
  Filled 2017-10-17: qty 56

## 2017-10-17 MED ORDER — SENNOSIDES-DOCUSATE SODIUM 8.6-50 MG PO TABS
2.0000 | ORAL_TABLET | ORAL | Status: DC
  Administered 2017-10-17 – 2017-10-19 (×2): 2 via ORAL
  Filled 2017-10-17 (×2): qty 2

## 2017-10-17 MED ORDER — WITCH HAZEL-GLYCERIN EX PADS: 1.0000 "application " | MEDICATED_PAD | CUTANEOUS | Status: DC | PRN

## 2017-10-17 MED ORDER — FENTANYL CITRATE (PF) 100 MCG/2ML IJ SOLN: 50.0000 ug | INTRAMUSCULAR | Status: DC | PRN

## 2017-10-17 MED ORDER — SIMETHICONE 80 MG PO CHEW: 80.0000 mg | CHEWABLE_TABLET | ORAL | Status: DC | PRN

## 2017-10-17 MED ORDER — DIPHENHYDRAMINE HCL 25 MG PO CAPS: 25.0000 mg | ORAL_CAPSULE | Freq: Four times a day (QID) | ORAL | Status: DC | PRN

## 2017-10-17 MED ORDER — ONDANSETRON HCL 4 MG/2ML IJ SOLN
4.0000 mg | Freq: Four times a day (QID) | INTRAMUSCULAR | Status: DC | PRN
  Administered 2017-10-17: 4 mg via INTRAVENOUS
  Filled 2017-10-17: qty 2

## 2017-10-17 MED ORDER — OXYTOCIN 40 UNITS IN LACTATED RINGERS INFUSION - SIMPLE MED
2.5000 [IU]/h | INTRAVENOUS | Status: DC
  Administered 2017-10-17: 2.5 [IU]/h via INTRAVENOUS
  Filled 2017-10-17: qty 1000

## 2017-10-17 MED ORDER — COCONUT OIL OIL: 1.0000 "application " | TOPICAL_OIL | Status: DC | PRN

## 2017-10-17 MED ORDER — DIBUCAINE 1 % RE OINT: 1.0000 "application " | TOPICAL_OINTMENT | RECTAL | Status: DC | PRN

## 2017-10-17 MED ORDER — SOD CITRATE-CITRIC ACID 500-334 MG/5ML PO SOLN: 30.0000 mL | ORAL | Status: DC | PRN

## 2017-10-17 MED ORDER — OXYTOCIN BOLUS FROM INFUSION
500.0000 mL | Freq: Once | INTRAVENOUS | Status: AC
  Administered 2017-10-17: 500 mL via INTRAVENOUS

## 2017-10-17 MED ORDER — TETANUS-DIPHTH-ACELL PERTUSSIS 5-2.5-18.5 LF-MCG/0.5 IM SUSP: 0.5000 mL | Freq: Once | INTRAMUSCULAR | Status: DC

## 2017-10-17 MED ORDER — LIDOCAINE HCL (PF) 1 % IJ SOLN
30.0000 mL | INTRAMUSCULAR | Status: AC | PRN
  Administered 2017-10-17: 30 mL via SUBCUTANEOUS
  Filled 2017-10-17: qty 30

## 2017-10-17 MED ORDER — LACTATED RINGERS IV SOLN
INTRAVENOUS | Status: DC
  Administered 2017-10-17 (×2): via INTRAVENOUS

## 2017-10-17 MED ORDER — ACETAMINOPHEN 325 MG PO TABS: 650.0000 mg | ORAL_TABLET | ORAL | Status: DC | PRN

## 2017-10-17 MED ORDER — LACTATED RINGERS IV SOLN
500.0000 mL | INTRAVENOUS | Status: DC | PRN
  Administered 2017-10-17: 500 mL via INTRAVENOUS

## 2017-10-17 MED ORDER — ONDANSETRON HCL 4 MG PO TABS: 4.0000 mg | ORAL_TABLET | ORAL | Status: DC | PRN

## 2017-10-17 NOTE — Progress Notes (Signed)
Renee Acosta MRN: [id number]  Subjective: -Nurse call requests provider at bedside.  In room to assess.  Patient on hands and knees, breathing through contractions.    Objective: BP 105/69   Pulse 76   Temp 97.6 F (36.4 C) (Oral)   Resp 20   Ht 5' (1.524 m)   Wt 68.5 kg (151 lb)   LMP 01/05/2017   BMI 29.49 kg/m  No intake/output data recorded. No intake/output data recorded.  Fetal Monitoring: FHT: 125 bpm, Mod Var, +prolonged Decels, +Accels UC: Q532min, palpates moderate    Vaginal Exam: SVE:   Dilation: 6 Effacement (%): 100 Station: -1 Exam by:: Renee Acosta. Renee Acosta, CNM Membranes:SROM  Internal Monitors: None  Augmentation/Induction: Pitocin:None Cytotec: None  Assessment:  IUP at 40.5wks Cat II FT  Active Labor   Plan: -Position change with resolution of Cat II FT -Plan to reassess at 0600 or prn -Continue other mgmt as ordered   Renee CavaJessica L [name],MSN, CNM 10/17/2017, 3:51 AM

## 2017-10-18 LAB — CBC
HCT: 28.7 % — ABNORMAL LOW (ref 36.0–46.0)
Hemoglobin: 9.1 g/dL — ABNORMAL LOW (ref 12.0–15.0)
MCH: 28.2 pg (ref 26.0–34.0)
MCHC: 31.7 g/dL (ref 30.0–36.0)
MCV: 88.9 fL (ref 78.0–100.0)
PLATELETS: 244 10*3/uL (ref 150–400)
RBC: 3.23 MIL/uL — AB (ref 3.87–5.11)
RDW: 13.4 % (ref 11.5–15.5)
WBC: 11.6 10*3/uL — ABNORMAL HIGH (ref 4.0–10.5)

## 2017-10-18 NOTE — Progress Notes (Signed)
CSW received consult for hx of marijuana use.  Referral was screened out due to the following: °~MOB had no documented substance use after initial prenatal visit/+UPT. °~MOB had no positive drug screens after initial prenatal visit/+UPT. °~Baby's UDS is negative. ° °Please consult CSW if current concerns arise or by MOB's request. ° °CSW will monitor CDS results and make report to Child Protective Services if warranted. ° °Phoenyx Melka Boyd-Gilyard, MSW, LCSW °Clinical Social Work °[phone] ° °

## 2017-10-18 NOTE — Progress Notes (Signed)
Subjective: Postpartum Day 1: Vaginal delivery, left vulvar and right labial laceration Patient up ad lib, reports no syncope or dizziness. Feeding:  Breastfeeding Contraceptive plan:  undecided  Objective: Vital signs in last 24 hours: Temp:  [97.8 F (36.6 C)-98.2 F (36.8 C)] 98.2 F (36.8 C) (12/16 2300) Pulse Rate:  [65-95] 71 (12/16 2300) Resp:  [18-20] 18 (12/16 2300) BP: (98-122)/(47-72) 98/47 (12/16 2300)  Physical Exam:  General: alert, cooperative and no distress Lochia: appropriate Uterine Fundus: firm Perineum: healing well DVT Evaluation: No evidence of DVT seen on physical exam. Negative Homan's sign.   CBC Latest Ref Rng & Units 10/17/2017 03/05/2017 09/02/2016  WBC 4.0 - 10.5 K/uL 7.3 5.6 5.3  Hemoglobin 12.0 - 15.0 g/dL 10.4(L) 12.1 12.4  Hematocrit 36.0 - 46.0 % 32.6(L) 35.0(L) 36.9  Platelets 150 - 400 K/uL 329 295 281     Assessment/Plan: Status post vaginal delivery day 1. Stable Continue current care. Plan for discharge tomorrow and Breastfeeding  Plans on out patient circumcision     Henderson Newcomerancy Jean ProtheroCNM 10/18/2017, 5:43 AM

## 2017-10-18 NOTE — Lactation Note (Signed)
This note was copied from a baby's chart. Lactation Consultation Note  Patient Name: Renee Acosta ONGEX'BToday's Date: 10/18/2017 Reason for consult: Initial assessment   Initial assessment with mom of 30 hour old infant. Infant with 6 BF for 10-50 minutes, 1 BF attempt, 3 voids and 2 stools in last 24 hours. Infant weight 7 lb 12.9 oz with 4% weight loss since birth.   Mom reports BF is going well. She reports she is having some difficulty with keeping infant awake at the breast. Infant was STS with mom and she was trying to latch him to the breast. Worked on deeper latch and pillow support with feeding. Showed mom awakening techniques and breast massage/compression with feeding. Infant was more active with awakening techniques and noted to have more swallows.   BF basics, cluster feeding, hand expression, feeding infant STS 8-12 x in 24 hours at first feeding cues, pillow and head support reviewed. Mom reports breast are feeling fuller today and denies nipple pain/tenderness. Breasts and areola are soft and compressible.   Bf Resources handout and LC Brochure given, mom informed of IP/OP Services, BF Support Groups and LC phone #. Mom has an Evenflo pump at home. [facility]   Mom reports she has no questions/concerns at this time.    Maternal Data Formula Feeding for Exclusion: No Has patient been taught Hand Expression?: Yes Does the patient have breastfeeding experience prior to this delivery?: No  Feeding Feeding Type: Breast Fed Length of feed: 15 min(still BF when LC left room)  LATCH Score Latch: Repeated attempts needed to sustain latch, nipple held in mouth throughout feeding, stimulation needed to elicit sucking reflex.  Audible Swallowing: A few with stimulation  Type of Nipple: Everted at rest and after stimulation  Comfort (Breast/Nipple): Soft / non-tender  Hold (Positioning): Assistance needed to correctly position infant at breast and  maintain latch.  LATCH Score: 7  Interventions Interventions: Breast feeding basics reviewed;Support pillows;Assisted with latch;Position options;Skin to skin;Expressed milk;Breast compression  Lactation Tools Discussed/Used WIC Program: Yes   Consult Status Consult Status: Follow-up Date: 10/19/17 Follow-up type: In-patient    [name] 10/18/2017, 3:02 PM

## 2017-10-19 MED ORDER — IBUPROFEN 600 MG PO TABS: 600.0000 mg | ORAL_TABLET | Freq: Four times a day (QID) | ORAL | 0 refills | Status: DC

## 2017-10-19 NOTE — Discharge Summary (Signed)
OB Discharge Summary     Patient Name: Renee Acosta DOB: [birth year] MRN: [id number]  Date of admission: 10/16/2017 Delivering MD: Gerrit HeckEMLY, JESSICA   Date of discharge: 10/19/2017  Admitting diagnosis: 40 WKS WATER BROKE AND CTX Intrauterine pregnancy: 6655w5d     Secondary diagnosis:  Principal Problem:   SVD (spontaneous vaginal delivery) Active Problems:   Indication for care or intervention in labor or delivery  Additional problems:None     Discharge diagnosis: Term Pregnancy Delivered                                                                                                Post partum procedures:None  Augmentation: None  Complications: None  Hospital course:  Onset of Labor With Vaginal Delivery     21 y.o. yo G1P1001 at 2455w5d was admitted in Active Labor on 10/16/2017. Patient had an uncomplicated labor course as follows:  Membrane Rupture Time/Date: 10:15 PM ,10/16/2017   Intrapartum Procedures: Episiotomy: None [1]                                         Lacerations:     Patient had a delivery of a Viable infant. 10/17/2017  Information for the patient's newborn:  Halford DecampKerr, Boy Gentry [[id number]][[id number]]  Delivery Method: Vag-Spont    Pateint had an uncomplicated postpartum course.  She is ambulating, tolerating a regular diet, passing flatus, and urinating well. Patient is discharged home in stable condition on 10/19/17.   Physical exam  Vitals:   10/17/17 2300 10/18/17 0623 10/18/17 1921 10/19/17 0500  BP: (!) 98/47 (!) 96/56 (!) 101/59 104/60  Pulse: 71 68 78 77  Resp: 18 16 18 18   Temp: 98.2 F (36.8 C) 98 F (36.7 C) 97.9 F (36.6 C) 97.8 F (36.6 C)  TempSrc: Oral Oral Oral Axillary  SpO2:   99%   Weight:      Height:       General: alert, cooperative and no distress Lochia: appropriate Uterine Fundus: firm Incision: Healing well DVT Evaluation: {No signs of DVT Lab Results  Component Value Date   WBC 11.6 (H) 10/18/2017   HGB 9.1 (L) 10/18/2017    HCT 28.7 (L) 10/18/2017   MCV 88.9 10/18/2017   PLT 244 10/18/2017   CMP Latest Ref Rng & Units 09/02/2016  Glucose 65 - 99 mg/dL 93  BUN 6 - 20 mg/dL 10  Creatinine 2.130.57 - 0.861.00 mg/dL 5.780.80  Sodium 469134 - 629144 mmol/L 143  Potassium 3.5 - 5.2 mmol/L 4.6  Chloride 96 - 106 mmol/L 102  CO2 18 - 29 mmol/L 21  Calcium 8.7 - 10.2 mg/dL 9.5  Total Protein 6.0 - 8.5 g/dL 7.2  Total Bilirubin 0.0 - 1.2 mg/dL 0.4  Alkaline Phos 39 - 117 IU/L 53  AST 0 - 40 IU/L 15  ALT 0 - 32 IU/L 7    Discharge instruction: per After Visit Summary and "Baby and Me Booklet".  After visit meds:  PNV, Motrin, FeSo4  Diet: routine diet  Activity: Advance as tolerated. Pelvic rest for 6 weeks.   Outpatient follow up:6 weeks Follow up Appt:No future appointments. Follow up Visit:No Follow-up on file.  Postpartum contraception: Undecided  Newborn Data: Live born female  Birth Weight: 8 lb 1.8 oz (3680 g) APGAR: 7, 9  Newborn Delivery   Birth date/time:  10/17/2017 07:05:00 Delivery type:  Vaginal, Spontaneous     Baby Feeding: Breast Disposition:home with mother   10/19/2017 Kenney HousemanNancy Jean Malacai Grantz, CNM

## 2017-10-19 NOTE — Lactation Note (Signed)
This note was copied from a baby's chart. Lactation Consultation Note  Patient Name: Renee Acosta XLKGM'WToday's Date: 10/19/2017 Reason for consult: Follow-up assessment;Term;Infant weight loss;Nipple pain/trauma(6% weight loss )  Baby is 50 hours  LC reviewed doc flow sheets and updated per mom As LC entered the room , baby being held by dad , asleep.  Per mom - nipples are sore and milk is in , [facility] with [facility]s  Permission - noted small crack on the nipple / areola complex.  Per  Mom comfortable hand expressing. Instructed on the use shells, comfort gels,  And hand pump. Reviewed sore nipple and engorgement prevention and treatment.    Maternal Data Has patient been taught Hand Expression?: Yes Does the patient have breastfeeding experience prior to this delivery?: No  Feeding Feeding Type: (last fed at 0830 per mom ) Length of feed: 15 min  LATCH Score ( Latch was done by RN )  Latch: Repeated attempts needed to sustain latch, nipple held in mouth throughout feeding, stimulation needed to elicit sucking reflex.  Audible Swallowing: A few with stimulation  Type of Nipple: Everted at rest and after stimulation  Comfort (Breast/Nipple): Soft / non-tender  Hold (Positioning): Assistance needed to correctly position infant at breast and maintain latch.  LATCH Score: 7  Interventions Interventions: Breast feeding basics reviewed;Hand pump;Shells  Lactation Tools Discussed/Used Tools: Shells;Pump;Comfort gels Shell Type: Inverted Breast pump type: Manual Pump Review: Setup, frequency, and cleaning;Milk Storage Initiated by:: MAI  Date initiated:: 10/19/17   Consult Status Consult Status: Complete Date: 10/19/17    Renee Acosta 10/19/2017, 9:36 AM

## 2019-11-03 NOTE — L&D Delivery Note (Signed)
.  Delivery Note Labor onset: 06/10/2020  Labor Onset Time: 1845 Complete dilation at 10:57 PM  Onset of pushing at 2257 FHR second stage Cat 1 Analgesia/Anesthesia intrapartum: none  CNM called to bedside when pt felt increased pressure w/ ctx @ 2209. CNM remained at bedside to provide support. Doula arrived approx 2240. Spouse present and supportive. Pt assisted from hands and knees to standing position. Guided pushing with strong maternal urge. Delivery of a viable female at 2306. Fetal head delivered in OA and restituted to LOA position. Nuchal cord none. Infant placed on maternal abd, dried, and tactile stim.  Cord double clamped after pulsation stopped and cut by [facility], spouse. Alexis (Doula) and Anza present for birth. Cord blood sample collected yes Arterial cord blood sample N/A.  Placenta delivered Tomasa Blase, intact, with 3 VC.  Placenta to L&D. Uterine tone firm, bleeding small. IV dislodged during birth, Pitocin 10U IM to left thigh  No laceration identified.  Anesthesia: none Repair: N/A EBL (mL): 250 Complications: none APGAR: APGAR (1 MIN): 8   APGAR (5 MINS): 9   APGAR (10 MINS):   Mom to postpartum.  Baby to Couplet care / Skin to Skin.  Roma Schanz MSN, CNM 06/11/2020, 12:08 AM

## 2019-12-04 LAB — OB RESULTS CONSOLE HIV ANTIBODY (ROUTINE TESTING): HIV: NONREACTIVE

## 2019-12-04 LAB — OB RESULTS CONSOLE RPR: RPR: NONREACTIVE

## 2019-12-04 LAB — OB RESULTS CONSOLE RUBELLA ANTIBODY, IGM: Rubella: IMMUNE

## 2019-12-04 LAB — OB RESULTS CONSOLE HEPATITIS B SURFACE ANTIGEN: Hepatitis B Surface Ag: NEGATIVE

## 2019-12-05 LAB — OB RESULTS CONSOLE GC/CHLAMYDIA
Chlamydia: NEGATIVE
Gonorrhea: NEGATIVE

## 2019-12-20 ENCOUNTER — Encounter (HOSPITAL_COMMUNITY): Payer: Self-pay | Admitting: *Deleted

## 2019-12-25 ENCOUNTER — Encounter (HOSPITAL_COMMUNITY): Payer: Self-pay

## 2019-12-25 ENCOUNTER — Ambulatory Visit (HOSPITAL_COMMUNITY): Payer: Medicaid Other

## 2019-12-27 ENCOUNTER — Other Ambulatory Visit (HOSPITAL_COMMUNITY): Payer: Self-pay | Admitting: Obstetrics and Gynecology

## 2019-12-27 DIAGNOSIS — Z3689 Encounter for other specified antenatal screening: Secondary | ICD-10-CM

## 2020-01-22 ENCOUNTER — Ambulatory Visit (HOSPITAL_COMMUNITY)
Admission: RE | Admit: 2020-01-22 | Payer: Medicaid Other | Source: Ambulatory Visit | Attending: Obstetrics and Gynecology | Admitting: Obstetrics and Gynecology

## 2020-01-26 ENCOUNTER — Other Ambulatory Visit (HOSPITAL_COMMUNITY): Payer: Self-pay | Admitting: Obstetrics and Gynecology

## 2020-01-26 ENCOUNTER — Ambulatory Visit (HOSPITAL_COMMUNITY)
Admission: RE | Admit: 2020-01-26 | Discharge: 2020-01-26 | Disposition: A | Discharge status: Home / Self Care | Payer: Medicaid Other | Source: Ambulatory Visit | Attending: Obstetrics and Gynecology | Admitting: Obstetrics and Gynecology

## 2020-01-26 DIAGNOSIS — Z3689 Encounter for other specified antenatal screening: Secondary | ICD-10-CM

## 2020-01-26 DIAGNOSIS — Z3A21 21 weeks gestation of pregnancy: Secondary | ICD-10-CM | POA: Diagnosis not present

## 2020-01-26 DIAGNOSIS — Z363 Encounter for antenatal screening for malformations: Secondary | ICD-10-CM | POA: Diagnosis not present

## 2020-01-26 DIAGNOSIS — O285 Abnormal chromosomal and genetic finding on antenatal screening of mother: Secondary | ICD-10-CM | POA: Diagnosis not present

## 2020-02-08 ENCOUNTER — Ambulatory Visit (HOSPITAL_COMMUNITY): Payer: Medicaid Other | Attending: Obstetrics and Gynecology | Admitting: Genetic Counselor

## 2020-02-08 ENCOUNTER — Ambulatory Visit (HOSPITAL_COMMUNITY): Payer: Self-pay | Admitting: Obstetrics and Gynecology

## 2020-02-08 DIAGNOSIS — Z315 Encounter for genetic counseling: Secondary | ICD-10-CM

## 2020-02-08 DIAGNOSIS — Z31430 Encounter of female for testing for genetic disease carrier status for procreative management: Secondary | ICD-10-CM

## 2020-02-08 NOTE — Progress Notes (Signed)
02/08/2020  Kirstan SYLVANNA BURGGRAF 1996/04/27 MRN: [id number] DOV: 02/08/2020  I connected withMs.Kerron4/8/21at1:00 PMEST byWebExand verified that I am speaking with the correct person using two identifiers. Ms. 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 Ms. Norlander presented to her appointment alone.  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 Beightol is a G17P1001, 24 y.o. female. Her current pregnancy has completed [redacted]w[redacted]d(Estimated Date of Delivery: 06/07/20).  Ms. KGhumandenied exposure to environmental toxins or chemical agents. She denied the use of alcohol, tobacco or street drugs. She reported taking prenatal vitamins. She denied significant viral illnesses, fevers, and bleeding during the course of her pregnancy. Her medical history is noncontributory.  Family History  A three generation pedigree was drafted and reviewed. The family history is remarkable for the following:  - Ms. KDingeehas a paternal aunt who had six miscarriages. This aunt also has one healthy daughter. Her aunt has a history of fibroids. We reviewed that there are many potential causes of recurrent miscarriages, including anatomic (including uterine fibroids), immunologic, endocrine, and genetic factors. However, a cause is only identified in 50% of individuals who experience recurrent miscarriages. Abnormalities of chromosome number or structure are the most common cause of sporadic early pregnancy loss. A significant proportion of recurrent miscarriages may also be associated with structural or numerical chromosome abnormalities. We discussed that 2-5% of couples who experience multiple miscarriages carry a balanced translocation that can become unbalanced and potentially lethal in their offspring. In addition to chromosomal abnormalities,  certain single gene, X-linked, and polygenic/multifactorial disorders are associated with recurrent miscarriage. Ms. KRytherwas informed if she were interested, her aunt could have an evaluation with a prenatal genetic counselor. I provided her information about the NCobbtownfind a gDietitiantool in case her aunt is interested in pursuing a genetics consultation.  - Ms. KHeatwolepartner, KChrisandra Netters has a maternal half brother with autism. The rest of this sibling's history is noncontributory, and no one else in the family has learning disabilities or autism. We reviewed that autism can be isolated, multifactorial, or part of a genetic syndrome; however, underlying genetic conditions account for less than 10% of autism diagnoses.Chance of recurrence for first-degree relatives of individuals with idiopathic autism is estimated to be between 2-8%. Given that Mr. JAdah Salvagebrother is a third-degree relative to the current fetus, the chance of recurrence is likely not greatly elevated above the general population risk of ~1 in 572 or 1.5%.   The remaining family histories were reviewed and found to be noncontributory for birth defects, intellectual disability, recurrent pregnancy loss, and known genetic conditions.    The patient's ethnicity is African American. The father of the pregnancy's ethnicity is African American. Ashkenazi Jewish ancestry and consanguinity were denied. Pedigree will be scanned under Media.  Discussion  Ms. KLutyhad Horizon-4 carrier screening performed through [facility] Based on the results of this screen, Ms. KVonderhaarwas found to have 2 copies of the SMN1 gene; however, she also has the c.*3+80T>G polymorphism of SMN1 in intron 7 (also known as g.[id number]>G). This puts her at increased risk (1 in 34, or ~3%) to be a silent 2+0 carrier for SMA. SMA is a condition caused by mutations in the SMN1 gene. Typically, individuals have two copies of the SMN1 gene,  with one copy present  on each chromosome. In SMA silent carriers, both copies of the SMN1 gene are present on one chromosome, with no copies of SMN1 present on the other chromosome.   SMA is characterized by progressive muscle weakness and atrophy due to degeneration and loss of anterior horn cells (lower motor neurons) in the spinal cord and brain stem. We discussed the different types of SMA (0, I, II, and III), including differences in severity and age of onset. We also reviewed the autosomal recessive inheritance pattern of SMA. We discussed that the couple only has a chance of having a child with SMA if both parents are identified as carriers for the condition. Based on the carrier frequency for SMA in the African American population, Ms. Nuno's partner currently has a 1 in 66 chance of being a carrier of SMA. Without partner screening to refine risk and based on her partner's ethnicity, the couple currently has a 1 in 8976 (0.01%) chance of having a child with SMA. If Ms. Bring's partner were found to have 2 copies of SMN1, his chance of being a carrier is reduced but not eliminated. However, if both parents are carriers of SMA, there is a 1 in 4 (25%) chance of having an affected fetus.   Ms. Fassler's carrier screening was negative for the other 3 conditions screened. Thus, her chance to be a carrier for these additional conditions (listed separately in the laboratory report) has been reduced but not eliminated. This also significantly reduces her chance of having a child affected by one of these conditions. We discussed that carrier testing for SMA is recommended for Ms. Wuertz's partner. Ms. Rorke indicated that she is interested in pursuing partner carrier screening.  We also reviewed that Ms. Liggett had Panorama NIPS through the laboratory [facility] that was low-risk for fetal aneuploidies. We reviewed that these results showed a less than 1 in 10,000 risk for trisomies 21, 18 and 13, and monosomy X (Turner  syndrome).  In addition, the risk for triploidy and sex chromosome trisomies (47,XXX and 47,XXY) was also low. Ms. Bott elected to have cfDNA analysis for 22q11.2 deletion syndrome, which was also low risk (1 in 9000). We reviewed that while this testing identifies 94-99% of pregnancies with trisomy 21, trisomy 13, trisomy 18, sex chromosome aneuploidies, and triploidy, it is NOT diagnostic. A positive test result requires confirmation by CVS or amniocentesis, and a negative test result does not rule out a fetal chromosome abnormality. She also understands that this testing does not identify all genetic conditions.  Ms. Escorcia was also counseled regarding diagnostic testing via amniocentesis. We discussed the technical aspects of the procedure and quoted up to a 1 in 500 (0.2%) risk for spontaneous pregnancy loss or other adverse pregnancy outcomes as a result of amniocentesis. Cultured cells from an amniocentesis sample allow for the visualization of a fetal karyotype, which can detect >99% of chromosomal aberrations. Chromosomal microarray can also be performed to identify smaller deletions or duplications of fetal chromosomal material. Amniocentesis could also be performed to assess whether the baby is affected by SMA. After careful consideration, Ms. Anaya declined amniocentesis at this time. She understands that amniocentesis is available at any point after 16 weeks of pregnancy and that she may opt to undergo the procedure at a later date should she change her mind.  Lastly, screening for open neural tube defects (ONTDs) via MS-AFP in the second trimester in addition to level II ultrasound examination is recommended. Ms. Munyan's level II ultrasound did not   detect any ONTDs. Level II ultrasound is able to detect ONTDs with 90-95% sensitivity. However, normal results from level II ultrasound and MS-AFP screening do not guarantee a normal baby, as 3-5% of newborns have some type of birth defect, many of which are  not prenatally diagnosable.  Ms. Victor was interested in pursuing SMA carrier screening for her partner, Chrisandra Netters; however, he does not have health insurance. Mr. Knute Neu for free testing through [facility]'s Patient Assistance Program. Ms. Osmer and I partially completed a Patient Assistance Program application for Mr. Arline today. I also placed an order for testing and had the laboratory mail a saliva kit to the couple's house. We made a plan for her to have him sign the application electronically, thenemail me aphoto of the signed form and a copy oftheirtax returnsso that I canprovide therequired documentation for free testingto the laboratory. Testingresults will take 2-3 weeks to become available from the time thelaboratoryreceives Mr.Jones's sample. I will call the couple when results become available.  I counseled Ms. Santee regarding the above risks and available options. The approximate face-to-face time with the genetic counselor was 35 minutes.  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 Placed order and had lab email couple a saliva kit. Ms. Deakins will email me photos of signed Patient Assistance Applicationand taxforms. We will follow results  Reviewed low-risk NIPS result  Reduction in risk for Down syndrome,trisomy 18,trisomy 49, sex chromosome aneuploidies, and 22q11.2 deletion syndrome  Offered additional testing and screening  Declined amniocentesis  Recommend MS-AFP screening  Reviewed family history concerns   Buelah Manis, MS, Counselling psychologist

## 2020-02-26 ENCOUNTER — Other Ambulatory Visit (HOSPITAL_COMMUNITY): Payer: Self-pay | Admitting: Obstetrics and Gynecology

## 2020-05-27 LAB — OB RESULTS CONSOLE GBS: GBS: NEGATIVE

## 2020-06-10 ENCOUNTER — Inpatient Hospital Stay (HOSPITAL_COMMUNITY)
Admission: AD | Admit: 2020-06-10 | Discharge: 2020-06-12 | DRG: 807 | Disposition: A | Discharge status: Home / Self Care | Payer: Medicaid Other | Attending: Obstetrics and Gynecology | Admitting: Obstetrics and Gynecology

## 2020-06-10 ENCOUNTER — Other Ambulatory Visit: Payer: Self-pay

## 2020-06-10 ENCOUNTER — Encounter (HOSPITAL_COMMUNITY): Payer: Self-pay | Admitting: Obstetrics and Gynecology

## 2020-06-10 DIAGNOSIS — Z148 Genetic carrier of other disease: Secondary | ICD-10-CM | POA: Diagnosis not present

## 2020-06-10 DIAGNOSIS — O329XX0 Maternal care for malpresentation of fetus, unspecified, not applicable or unspecified: Secondary | ICD-10-CM

## 2020-06-10 DIAGNOSIS — Z3A40 40 weeks gestation of pregnancy: Secondary | ICD-10-CM | POA: Diagnosis not present

## 2020-06-10 DIAGNOSIS — Z20822 Contact with and (suspected) exposure to covid-19: Secondary | ICD-10-CM | POA: Diagnosis present

## 2020-06-10 DIAGNOSIS — Z2839 Other underimmunization status: Secondary | ICD-10-CM

## 2020-06-10 DIAGNOSIS — O26893 Other specified pregnancy related conditions, third trimester: Secondary | ICD-10-CM | POA: Diagnosis present

## 2020-06-10 LAB — CBC
HCT: 36.6 % (ref 36.0–46.0)
Hemoglobin: 11.6 g/dL — ABNORMAL LOW (ref 12.0–15.0)
MCH: 28.9 pg (ref 26.0–34.0)
MCHC: 31.7 g/dL (ref 30.0–36.0)
MCV: 91 fL (ref 80.0–100.0)
Platelets: 223 10*3/uL (ref 150–400)
RBC: 4.02 MIL/uL (ref 3.87–5.11)
RDW: 14.5 % (ref 11.5–15.5)
WBC: 12.6 10*3/uL — ABNORMAL HIGH (ref 4.0–10.5)
nRBC: 0 % (ref 0.0–0.2)

## 2020-06-10 LAB — SARS CORONAVIRUS 2 BY RT PCR (HOSPITAL ORDER, PERFORMED IN ~~LOC~~ HOSPITAL LAB): SARS Coronavirus 2: NEGATIVE

## 2020-06-10 LAB — POCT FERN TEST: POCT Fern Test: POSITIVE

## 2020-06-10 LAB — TYPE AND SCREEN
ABO/RH(D): O POS
Antibody Screen: NEGATIVE

## 2020-06-10 MED ORDER — SOD CITRATE-CITRIC ACID 500-334 MG/5ML PO SOLN: 30.0000 mL | ORAL | Status: DC | PRN

## 2020-06-10 MED ORDER — OXYTOCIN BOLUS FROM INFUSION: 333.0000 mL | Freq: Once | INTRAVENOUS | Status: DC

## 2020-06-10 MED ORDER — LACTATED RINGERS IV SOLN: 500.0000 mL | INTRAVENOUS | Status: DC | PRN

## 2020-06-10 MED ORDER — OXYTOCIN 10 UNIT/ML IJ SOLN
INTRAMUSCULAR | Status: AC
  Administered 2020-06-10: 10 [IU] via INTRAMUSCULAR
  Filled 2020-06-10: qty 1

## 2020-06-10 MED ORDER — OXYTOCIN-SODIUM CHLORIDE 30-0.9 UT/500ML-% IV SOLN
2.5000 [IU]/h | INTRAVENOUS | Status: DC
  Filled 2020-06-10: qty 500

## 2020-06-10 MED ORDER — ONDANSETRON HCL 4 MG/2ML IJ SOLN: 4.0000 mg | Freq: Four times a day (QID) | INTRAMUSCULAR | Status: DC | PRN

## 2020-06-10 MED ORDER — LACTATED RINGERS IV SOLN: INTRAVENOUS | Status: DC

## 2020-06-10 MED ORDER — LIDOCAINE HCL (PF) 1 % IJ SOLN: 30.0000 mL | INTRAMUSCULAR | Status: DC | PRN

## 2020-06-10 MED ORDER — ACETAMINOPHEN 325 MG PO TABS: 650.0000 mg | ORAL_TABLET | ORAL | Status: DC | PRN

## 2020-06-10 MED ORDER — OXYTOCIN 10 UNIT/ML IJ SOLN: 10.0000 [IU] | Freq: Once | INTRAMUSCULAR | Status: AC

## 2020-06-10 NOTE — H&P (Signed)
OB ADMISSION/ HISTORY & PHYSICAL:  Admission Date: 06/10/2020  7:34 PM  Admit Diagnosis: SROM, active labor  Renee Acosta is a 24 y.o. female G2P1001 [redacted]w[redacted]d presenting for LOF. Endorses active FM, denies vaginal bleeding. LOF was noted @ 1830 today as clear fluid from vagina. Fern + in MAU. Vertex position per limited U/S in MAU.   History of current pregnancy: G2P1001   Patient entered care with [facility] at 10+3 wks.   EDC of 06/07/20 was established by [place] @ 12+6 wks   Anatomy scan:  21 wks, with normal findings and posterior placenta.   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 Varicella non-immune, exposed to chicken pox 08/07/17, declined vaccine.  Patient Active Problem List   Diagnosis Date Noted  . 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 Polycystic ovarian syndrome around 2015    Past surgical history:  Past Surgical History:  Procedure Laterality Date  . NO PAST SURGERIES     Family History:  Family History  Problem Relation Age of Onset  . Varicose Veins Mother   . Varicose Veins Maternal Grandmother   . Lupus Paternal Grandmother   . Arthritis  Paternal Grandmother   . Ovarian cancer Paternal Grandmother   . Varicose Veins Paternal Grandmother   . Lung cancer Paternal Grandfather     Social History:  reports that she has never smoked. She has never used smokeless tobacco. She reports current drug use. Drug: Marijuana. She reports that she does not drink alcohol.  Allergies: Patient has no known allergies.   Current Medications at time of admission:  Prior to Admission medications   Medication Sig Start Date End Date Taking? Authorizing Provider  Prenatal MV & Min w/FA-DHA (PRENATAL ADULT GUMMY/DHA/FA) 0.4-25 MG CHEW Chew 2 tablets by mouth daily.   Yes [provider]  Ferrous Sulfate (IRON SUPPLEMENT PO) Take 1 capsule by mouth daily.    [provider]  ibuprofen (ADVIL,MOTRIN) 600 MG tablet Take 1 tablet (600 mg total) by mouth every 6 (six) hours. 10/19/17   10/21/17, MD    Review of Systems: Constitutional: Negative   HENT: Negative   Eyes: Negative   Respiratory: Negative   Cardiovascular: Negative   Gastrointestinal: Negative  Genitourinary: + for bloody show, + for LOF   Musculoskeletal: Negative   Skin: Negative   Neurological: Negative   Endo/Heme/Allergies: Negative   Psychiatric/Behavioral: Negative    Physical Exam: VS: Blood pressure 111/66, pulse (!) 107, temperature 98.3 F (36.8 C), temperature source Oral, resp. rate 18,  height 5' (1.524 m), weight 77.5 kg, last menstrual period 09/08/2019, unknown if currently breastfeeding. AAO x3, no signs of distress Cardiovascular: RRR Respiratory: Lung fields clear to ausculation GU/GI: Abdomen gravid, non-tender, non-distended, active FM, vertex Extremities: no edema, negative for pain, tenderness, and cords  Cervical exam:Dilation: 7 Effacement (%): 90 Station:  (undeterminable) Exam by:: T LYTLE RN  FHR: baseline rate 155 / variability moderate / accelerations present / absent decelerations TOCO: Q 1-2.5 min   Prenatal  Transfer Tool  Maternal Diabetes: No Genetic Screening: Normal-low risk female. 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 G2P1001 [redacted]w[redacted]d SROM  SMA carrier Varicella non-immune  Active stage of labor FHR category 1 GBS negative Pain management plan: desires nmedicated labor and birth, movement and shower   Plan:  Admit to L&D Routine admission orders Intermittent FM for Cat 1 surveillance Epidural PRN  Dr Su Hilt notified of admission and plan of care  Roma Schanz MSN, CNM 06/10/2020 8:59 PM

## 2020-06-10 NOTE — MAU Provider Note (Signed)
Pt informed that the ultrasound is considered a limited OB ultrasound and is not intended to be a complete ultrasound exam.  Patient also informed that the ultrasound is not being completed with the intent of assessing for fetal or placental anomalies or any pelvic abnormalities.  Explained that the purpose of today's ultrasound is to assess for  presentation (VTX).  Patient acknowledges the purpose of the exam and the limitations of the study.    Marylen Ponto, NP  8:33 PM 06/10/2020

## 2020-06-10 NOTE — MAU Note (Signed)
Pt here with reports that water broke at 6:45pm, clear fluid. Having contractions about every 3-4 minutes apart. She denies vaginal bleeding. Good fetal movement. Cervix was 5cm last week.

## 2020-06-11 ENCOUNTER — Encounter (HOSPITAL_COMMUNITY): Payer: Self-pay | Admitting: Obstetrics and Gynecology

## 2020-06-11 DIAGNOSIS — Z2839 Other underimmunization status: Secondary | ICD-10-CM

## 2020-06-11 LAB — CBC
HCT: 34.1 % — ABNORMAL LOW (ref 36.0–46.0)
Hemoglobin: 11.1 g/dL — ABNORMAL LOW (ref 12.0–15.0)
MCH: 29.6 pg (ref 26.0–34.0)
MCHC: 32.6 g/dL (ref 30.0–36.0)
MCV: 90.9 fL (ref 80.0–100.0)
Platelets: 203 10*3/uL (ref 150–400)
RBC: 3.75 MIL/uL — ABNORMAL LOW (ref 3.87–5.11)
RDW: 14.5 % (ref 11.5–15.5)
WBC: 17.2 10*3/uL — ABNORMAL HIGH (ref 4.0–10.5)
nRBC: 0 % (ref 0.0–0.2)

## 2020-06-11 LAB — RPR: RPR Ser Ql: NONREACTIVE

## 2020-06-11 MED ORDER — TETANUS-DIPHTH-ACELL PERTUSSIS 5-2.5-18.5 LF-MCG/0.5 IM SUSP: 0.5000 mL | Freq: Once | INTRAMUSCULAR | Status: DC

## 2020-06-11 MED ORDER — IBUPROFEN 600 MG PO TABS
600.0000 mg | ORAL_TABLET | Freq: Four times a day (QID) | ORAL | Status: DC
  Administered 2020-06-11 – 2020-06-12 (×6): 600 mg via ORAL
  Filled 2020-06-11 (×6): qty 1

## 2020-06-11 MED ORDER — ONDANSETRON HCL 4 MG/2ML IJ SOLN: 4.0000 mg | INTRAMUSCULAR | Status: DC | PRN

## 2020-06-11 MED ORDER — ACETAMINOPHEN 325 MG PO TABS: 650.0000 mg | ORAL_TABLET | ORAL | Status: DC | PRN

## 2020-06-11 MED ORDER — DIPHENHYDRAMINE HCL 25 MG PO CAPS: 25.0000 mg | ORAL_CAPSULE | Freq: Four times a day (QID) | ORAL | Status: DC | PRN

## 2020-06-11 MED ORDER — PRENATAL MULTIVITAMIN CH
1.0000 | ORAL_TABLET | Freq: Every day | ORAL | Status: DC
  Administered 2020-06-11 – 2020-06-12 (×2): 1 via ORAL
  Filled 2020-06-11 (×2): qty 1

## 2020-06-11 MED ORDER — ONDANSETRON HCL 4 MG PO TABS: 4.0000 mg | ORAL_TABLET | ORAL | Status: DC | PRN

## 2020-06-11 MED ORDER — DIBUCAINE (PERIANAL) 1 % EX OINT: 1.0000 "application " | TOPICAL_OINTMENT | CUTANEOUS | Status: DC | PRN

## 2020-06-11 MED ORDER — COCONUT OIL OIL: 1.0000 "application " | TOPICAL_OIL | Status: DC | PRN

## 2020-06-11 MED ORDER — VARICELLA VIRUS VACCINE LIVE 1350 PFU/0.5ML IJ SUSR
0.5000 mL | Freq: Once | INTRAMUSCULAR | Status: DC
  Filled 2020-06-11: qty 0.5

## 2020-06-11 MED ORDER — ZOLPIDEM TARTRATE 5 MG PO TABS: 5.0000 mg | ORAL_TABLET | Freq: Every evening | ORAL | Status: DC | PRN

## 2020-06-11 MED ORDER — BENZOCAINE-MENTHOL 20-0.5 % EX AERO: 1.0000 "application " | INHALATION_SPRAY | CUTANEOUS | Status: DC | PRN

## 2020-06-11 MED ORDER — SIMETHICONE 80 MG PO CHEW: 80.0000 mg | CHEWABLE_TABLET | ORAL | Status: DC | PRN

## 2020-06-11 MED ORDER — WITCH HAZEL-GLYCERIN EX PADS: 1.0000 "application " | MEDICATED_PAD | CUTANEOUS | Status: DC | PRN

## 2020-06-11 MED ORDER — SENNOSIDES-DOCUSATE SODIUM 8.6-50 MG PO TABS
2.0000 | ORAL_TABLET | ORAL | Status: DC
  Administered 2020-06-11: 2 via ORAL
  Filled 2020-06-11: qty 2

## 2020-06-11 NOTE — Progress Notes (Signed)
CSW received consult for hx of marijuana use.  Referral was screened out due to the following: ~MOB had no documented substance use after initial prenatal visit/+UPT. ~MOB had no positive drug screens after initial prenatal visit/+UPT. ~Baby's UDS is negative.  Please consult CSW if current concerns arise or by MOB's request.  CSW will monitor CDS results and make report to Child Protective Services if warranted.   Renee Acosta, MSW, LCSW [facility] [phone]    

## 2020-06-11 NOTE — Progress Notes (Signed)
PPD # 1 S/P NSVD  Live born female  Birth Weight: 8 lb 9.9 oz (3909 g) APGAR: 8, 9  Newborn Delivery   Birth date/time: 06/10/2020 23:06:00 Delivery type: Vaginal, Spontaneous     Baby name: Kamil Delivering provider: Rhea Pink B  Episiotomy:None   Lacerations:None   Circumcision: yes, planning inpatient  Feeding: breast  Pain control at delivery: None   S:  Reports feeling good. Desires discharge but verbalizes understanding of need to keep baby for 24 hours.              Tolerating po/ No nausea or vomiting             Bleeding is moderate             Pain controlled with acetaminophen and ibuprofen (OTC)             Up ad lib / ambulatory / voiding without difficulties   O:  A & O x 3, in no apparent distress              VS:  Vitals:   06/11/20 0130 06/11/20 0235 06/11/20 0614 06/11/20 0818  BP: 106/68 113/76 100/61 94/61  Pulse: 97 82 91 87  Resp: 18 18 18 17   Temp: 98 F (36.7 C) 97.8 F (36.6 C) 97.9 F (36.6 C) 98 F (36.7 C)  TempSrc: Oral Oral Oral Oral  SpO2: 100%   99%  Weight:      Height:        LABS:  Recent Labs    06/10/20 2058 06/11/20 0517  WBC 12.6* 17.2*  HGB 11.6* 11.1*  HCT 36.6 34.1*  PLT 223 203    Blood type: --/--/O POS (08/09 2035)  Rubella: Immune (02/01 0000)   I&O: I/O last 3 completed shifts: In: -  Out: 250 [Blood:250]          No intake/output data recorded.   Gen: AAO x 3, NAD  Abdomen: soft, non-tender, non-distended             Fundus: firm, non-tender, U-1  Perineum: intact  Lochia: moderate  Extremities: no edema, no calf pain or tenderness   A/P: PPD # 1 24 y.o., 07-20-1981   Principal Problem:   Postpartum care following vaginal delivery 8/9 Active Problems:   Normal labor   Maternal varicella, non-immune   SVD (8/9)   Doing well - stable status  Routine post partum orders  Anticipate discharge tomorrow   09-19-1999, MSN, CNM 06/11/2020, 9:33 AM

## 2020-06-12 MED ORDER — IBUPROFEN 600 MG PO TABS: 600.0000 mg | ORAL_TABLET | Freq: Four times a day (QID) | ORAL | 0 refills | Status: DC

## 2020-06-12 NOTE — Discharge Summary (Signed)
   Postpartum Discharge Summary       Patient Name: Renee Acosta DOB: 09/22/1996 MRN: [id number]  Date of admission: 06/10/2020 Delivery date:06/10/2020  Delivering provider: GRICE, VIVIAN B  Date of discharge: 06/12/2020  Admitting diagnosis: Normal labor [O80, Z37.9] Intrauterine pregnancy: [redacted]w[redacted]d     Secondary diagnosis:  Principal Problem:   Postpartum care following vaginal delivery 8/9 Active Problems:   Normal labor   Maternal varicella, non-immune   SVD (8/9)  Additional problems: NA    Discharge diagnosis: Term Pregnancy Delivered                                              Post partum procedures:NA Augmentation: N/A Complications: None  Hospital course: Onset of Labor With Vaginal Delivery      24 y.o. yo G2P2002 at [redacted]w[redacted]d was admitted in Active Labor on 06/10/2020. Patient had an uncomplicated labor course as follows:  Membrane Rupture Time/Date: 6:45 PM ,06/10/2020   Delivery Method:Vaginal, Spontaneous  Episiotomy: None  Lacerations:  None  Patient had an uncomplicated postpartum course.  She is ambulating, tolerating a regular diet, passing flatus, and urinating well. Patient is discharged home in stable condition on 06/12/20.  Newborn Data: Birth date:06/10/2020  Birth time:11:06 PM  Gender:Female  Living status:Living  Apgars:8 ,9  Weight:3909 g   Magnesium Sulfate received: No BMZ received: No Rhophylac:N/A MMR:N/A T-DaP:Given prenatally Flu: No Transfusion:No  Physical exam  Vitals:   06/11/20 0818 06/11/20 1550 06/11/20 2228 06/12/20 0540  BP: 94/61 (!) 97/57 90/61 97/64  Pulse: 87 70 78 77  Resp: 17 18 18 18  Temp: 98 F (36.7 C) 97.8 F (36.6 C) 98.2 F (36.8 C) 97.7 F (36.5 C)  TempSrc: Oral Oral Oral Oral  SpO2: 99% 100%    Weight:      Height:       General: alert, cooperative and no distress Lochia: appropriate Uterine Fundus: firm Incision: N/A DVT Evaluation: No evidence of DVT seen on physical exam. Negative Homan's sign. No  cords or calf tenderness. No significant calf/ankle edema. Labs: Lab Results  Component Value Date   WBC 17.2 (H) 06/11/2020   HGB 11.1 (L) 06/11/2020   HCT 34.1 (L) 06/11/2020   MCV 90.9 06/11/2020   PLT 203 06/11/2020   CMP Latest Ref Rng & Units 09/02/2016  Glucose 65 - 99 mg/dL 93  BUN 6 - 20 mg/dL 10  Creatinine 0.57 - 1.00 mg/dL 0.80  Sodium 134 - 144 mmol/L 143  Potassium 3.5 - 5.2 mmol/L 4.6  Chloride 96 - 106 mmol/L 102  CO2 18 - 29 mmol/L 21  Calcium 8.7 - 10.2 mg/dL 9.5  Total Protein 6.0 - 8.5 g/dL 7.2  Total Bilirubin 0.0 - 1.2 mg/dL 0.4  Alkaline Phos 39 - 117 IU/L 53  AST 0 - 40 IU/L 15  ALT 0 - 32 IU/L 7   Edinburgh Score: Edinburgh Postnatal Depression Scale Screening Tool 06/12/2020  I have been able to laugh and see the funny side of things. 0  I have looked forward with enjoyment to things. 0  I have blamed myself unnecessarily when things went wrong. 1  I have been anxious or worried for no good reason. 1  I have felt scared or panicky for no good reason. 0  Things have been getting on top of me. 0  I have   been so unhappy that I have had difficulty sleeping. 0  I have felt sad or miserable. 0  I have been so unhappy that I have been crying. 0  The thought of harming myself has occurred to me. 0  Edinburgh Postnatal Depression Scale Total 2        Discharge home in stable condition Infant Feeding: Breast Infant Disposition:home with mother Discharge instruction: per After Visit Summary and Postpartum booklet. Activity: Advance as tolerated. Pelvic rest for 6 weeks.  Diet: routine diet Anticipated Birth Control: Condoms Postpartum Appointment:6 weeks Additional Postpartum F/U: NA Future Appointments:No future appointments. Follow up Visit:  Follow-up Information    [facility], [facility] Follow up in 6 week(s).   Specialty: Obstetrics and Gynecology Contact information: [address]  [place]  [address] [phone]                   06/12/2020 Jennifer B Crumpler, CNM  

## 2020-06-12 NOTE — Lactation Note (Signed)
This note was copied from a baby's chart. Lactation Consultation Note  Patient Name: Renee Acosta [id number] Date: 06/12/2020 Reason for consult: Initial assessment;MD order  Mom called for me to assist with latch. Mom was placed in a side-lying position. Using the teacup hold, infant latched with ease to L breast. Swallows were noted (verified by cervical auscultation; parents were taught breast compression to increase frequency of swallows) . Infant fed well for 21 minutes & was content/asleep after feeding.   Mom was provided w/a hand pump w/size 21 flange to pump L breast.    Mom has a Spectra pump at home. Mom reports her milk came to volume on Day 2 or 3 with her 1st child. Mom to call me to observe next feeding.   Renee Acosta [facility] 06/12/2020, 11:58 AM

## 2020-06-12 NOTE — Lactation Note (Signed)
This note was copied from a baby's chart. Lactation Consultation Note  Patient Name: Renee Acosta [id number] Date: 06/12/2020   Initial visit at 34 hours of life. Mom initially declined an LC visit, but MD order was placed this morning. Mom is a P2.   Lactation hx: she nursed her 1st baby for 1 yr. She did not experience a delay in lactogenesis II with her 1st child, nor did she use any formula. Mom reports having an abundant supply once her milk came to volume.  Mom reports that infant will go to the breast, latch, come off, act as if he can't find the nipple even though it's in his mouth & then will repeat. Mom's nipples are noted to be everted.  Mom knows how to do hand expression, but I taught her a different technique.   Infant is currently sleeping, she recently fed him at the breast followed with a small amount of EBM via spoon. Mom to call me when infant is ready to feed again.   Mom will likely need size 21 flanges for pumping.   Lurline [facility] 06/12/2020, 9:59 AM

## 2020-11-02 HISTORY — PX: WISDOM TOOTH EXTRACTION: SHX21

## 2021-11-02 NOTE — L&D Delivery Note (Signed)
Delivery Note At 7:58 AM a viable female was delivered via Vaginal, Spontaneous (Presentation: Direct Occiput Anterior that restituted to Right Occiput Anterior).  APGAR: 8, 9; weight 9 lb 2.4 oz (4150 g).  Tight nuchal cord was doubly clamped and cut.  Left anterior and posterior shoulders and body delivered without difficulty.  Placenta status: Spontaneous;Expressed, Intact, delivered with gentle traction on cord and counter traction on uterus.  Cord: 3 vessels with the following complications: None.  Cord pH: Not drawn.   Anesthesia: None Episiotomy: None Lacerations: None Suture Repair: N/A Est. Blood Loss (mL): 104  Mom to postpartum.  Baby to Couplet care / Skin to Skin.  Prescilla Sours, MD. 07/14/2022, 8:21 AM

## 2022-01-12 LAB — HEPATITIS C ANTIBODY: HCV Ab: NEGATIVE

## 2022-01-12 LAB — OB RESULTS CONSOLE RUBELLA ANTIBODY, IGM: Rubella: IMMUNE

## 2022-01-12 LAB — OB RESULTS CONSOLE RPR: RPR: NONREACTIVE

## 2022-01-12 LAB — OB RESULTS CONSOLE HEPATITIS B SURFACE ANTIGEN: Hepatitis B Surface Ag: NEGATIVE

## 2022-01-19 LAB — OB RESULTS CONSOLE GC/CHLAMYDIA
Chlamydia: NEGATIVE
Neisseria Gonorrhea: NEGATIVE

## 2022-01-19 LAB — OB RESULTS CONSOLE HIV ANTIBODY (ROUTINE TESTING): HIV: NONREACTIVE

## 2022-04-21 LAB — OB RESULTS CONSOLE RPR: RPR: NONREACTIVE

## 2022-06-25 LAB — OB RESULTS CONSOLE GBS: GBS: NEGATIVE

## 2022-07-14 ENCOUNTER — Other Ambulatory Visit: Payer: Self-pay

## 2022-07-14 ENCOUNTER — Encounter (HOSPITAL_COMMUNITY): Payer: Self-pay

## 2022-07-14 ENCOUNTER — Inpatient Hospital Stay (HOSPITAL_COMMUNITY)
Admission: AD | Admit: 2022-07-14 | Discharge: 2022-07-15 | DRG: 807 | Disposition: A | Discharge status: Home / Self Care | Payer: Medicaid Other | Attending: Obstetrics & Gynecology | Admitting: Obstetrics & Gynecology

## 2022-07-14 DIAGNOSIS — O26893 Other specified pregnancy related conditions, third trimester: Secondary | ICD-10-CM | POA: Diagnosis present

## 2022-07-14 DIAGNOSIS — Z3A39 39 weeks gestation of pregnancy: Secondary | ICD-10-CM | POA: Diagnosis not present

## 2022-07-14 LAB — RPR: RPR Ser Ql: NONREACTIVE

## 2022-07-14 LAB — CBC
HCT: 35.4 % — ABNORMAL LOW (ref 36.0–46.0)
Hemoglobin: 11.3 g/dL — ABNORMAL LOW (ref 12.0–15.0)
MCH: 27.3 pg (ref 26.0–34.0)
MCHC: 31.9 g/dL (ref 30.0–36.0)
MCV: 85.5 fL (ref 80.0–100.0)
Platelets: 242 10*3/uL (ref 150–400)
RBC: 4.14 MIL/uL (ref 3.87–5.11)
RDW: 14.9 % (ref 11.5–15.5)
WBC: 11.2 10*3/uL — ABNORMAL HIGH (ref 4.0–10.5)
nRBC: 0 % (ref 0.0–0.2)

## 2022-07-14 LAB — TYPE AND SCREEN
ABO/RH(D): O POS
Antibody Screen: NEGATIVE

## 2022-07-14 MED ORDER — DIBUCAINE (PERIANAL) 1 % EX OINT: 1.0000 | TOPICAL_OINTMENT | CUTANEOUS | Status: DC | PRN

## 2022-07-14 MED ORDER — OXYCODONE-ACETAMINOPHEN 5-325 MG PO TABS: 1.0000 | ORAL_TABLET | ORAL | Status: DC | PRN

## 2022-07-14 MED ORDER — ACETAMINOPHEN 325 MG PO TABS: 650.0000 mg | ORAL_TABLET | ORAL | Status: DC | PRN

## 2022-07-14 MED ORDER — ONDANSETRON HCL 4 MG/2ML IJ SOLN: 4.0000 mg | INTRAMUSCULAR | Status: DC | PRN

## 2022-07-14 MED ORDER — COCONUT OIL OIL: 1.0000 | TOPICAL_OIL | Status: DC | PRN

## 2022-07-14 MED ORDER — LACTATED RINGERS IV SOLN: INTRAVENOUS | Status: DC

## 2022-07-14 MED ORDER — OXYCODONE-ACETAMINOPHEN 5-325 MG PO TABS: 2.0000 | ORAL_TABLET | ORAL | Status: DC | PRN

## 2022-07-14 MED ORDER — PRENATAL MULTIVITAMIN CH
1.0000 | ORAL_TABLET | Freq: Every day | ORAL | Status: DC
  Administered 2022-07-14 – 2022-07-15 (×2): 1 via ORAL
  Filled 2022-07-14 (×2): qty 1

## 2022-07-14 MED ORDER — MAGNESIUM HYDROXIDE 400 MG/5ML PO SUSP: 30.0000 mL | ORAL | Status: DC | PRN

## 2022-07-14 MED ORDER — SIMETHICONE 80 MG PO CHEW: 80.0000 mg | CHEWABLE_TABLET | ORAL | Status: DC | PRN

## 2022-07-14 MED ORDER — VARICELLA VIRUS VACCINE LIVE 1350 PFU/0.5ML IJ SUSR: 0.5000 mL | INTRAMUSCULAR | Status: DC | PRN

## 2022-07-14 MED ORDER — LIDOCAINE HCL (PF) 1 % IJ SOLN: 30.0000 mL | INTRAMUSCULAR | Status: DC | PRN

## 2022-07-14 MED ORDER — SENNOSIDES-DOCUSATE SODIUM 8.6-50 MG PO TABS
2.0000 | ORAL_TABLET | Freq: Every day | ORAL | Status: DC
  Administered 2022-07-15: 2 via ORAL
  Filled 2022-07-14: qty 2

## 2022-07-14 MED ORDER — OXYTOCIN BOLUS FROM INFUSION
333.0000 mL | Freq: Once | INTRAVENOUS | Status: AC
  Administered 2022-07-14: 333 mL via INTRAVENOUS

## 2022-07-14 MED ORDER — LACTATED RINGERS IV SOLN: 500.0000 mL | INTRAVENOUS | Status: DC | PRN

## 2022-07-14 MED ORDER — ONDANSETRON HCL 4 MG/2ML IJ SOLN: 4.0000 mg | Freq: Four times a day (QID) | INTRAMUSCULAR | Status: DC | PRN

## 2022-07-14 MED ORDER — ZOLPIDEM TARTRATE 5 MG PO TABS: 5.0000 mg | ORAL_TABLET | Freq: Every evening | ORAL | Status: DC | PRN

## 2022-07-14 MED ORDER — ONDANSETRON HCL 4 MG PO TABS: 4.0000 mg | ORAL_TABLET | ORAL | Status: DC | PRN

## 2022-07-14 MED ORDER — OXYTOCIN-SODIUM CHLORIDE 30-0.9 UT/500ML-% IV SOLN
2.5000 [IU]/h | INTRAVENOUS | Status: DC
  Administered 2022-07-14: 2.5 [IU]/h via INTRAVENOUS
  Filled 2022-07-14: qty 500

## 2022-07-14 MED ORDER — IBUPROFEN 600 MG PO TABS
600.0000 mg | ORAL_TABLET | Freq: Four times a day (QID) | ORAL | Status: DC
  Administered 2022-07-14 – 2022-07-15 (×5): 600 mg via ORAL
  Filled 2022-07-14 (×5): qty 1

## 2022-07-14 MED ORDER — SOD CITRATE-CITRIC ACID 500-334 MG/5ML PO SOLN: 30.0000 mL | ORAL | Status: DC | PRN

## 2022-07-14 MED ORDER — FLEET ENEMA 7-19 GM/118ML RE ENEM: 1.0000 | ENEMA | RECTAL | Status: DC | PRN

## 2022-07-14 MED ORDER — OXYCODONE HCL 5 MG PO TABS: 5.0000 mg | ORAL_TABLET | ORAL | Status: DC | PRN

## 2022-07-14 MED ORDER — BENZOCAINE-MENTHOL 20-0.5 % EX AERO: 1.0000 | INHALATION_SPRAY | CUTANEOUS | Status: DC | PRN

## 2022-07-14 MED ORDER — DIPHENHYDRAMINE HCL 25 MG PO CAPS: 25.0000 mg | ORAL_CAPSULE | Freq: Four times a day (QID) | ORAL | Status: DC | PRN

## 2022-07-14 MED ORDER — WITCH HAZEL-GLYCERIN EX PADS: 1.0000 | MEDICATED_PAD | CUTANEOUS | Status: DC | PRN

## 2022-07-14 MED ORDER — OXYCODONE HCL 5 MG PO TABS: 10.0000 mg | ORAL_TABLET | ORAL | Status: DC | PRN

## 2022-07-14 NOTE — H&P (Signed)
Renee Acosta is a 26 y.o. female presenting for labor. [id number] at 39 weeks,LMP 10/18/21, EDD by first trimester U/S.  Patient with contractions. She denies vaginal bleeding or leakage of fluid.    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 Varicella Non Immune, for vaccination postpartum.  OB History     Gravida  3   Para  3   Term  3   Preterm      AB      Living  3      SAB      IAB      Ectopic      Multiple  0   Live Births  3          Past Medical History:  Diagnosis Date   Polycystic ovarian syndrome around 62   Past Surgical History:  Procedure Laterality Date   NO PAST SURGERIES     Family History: family history includes Arthritis in her paternal grandmother; Lung cancer in her paternal grandfather; Lupus in her paternal grandmother; Ovarian cancer in her paternal grandmother; Varicose Veins in her maternal grandmother, mother, and paternal grandmother. Social History:  reports that she has never smoked. She has never used smokeless tobacco. She reports that she does not currently use drugs after having used the following drugs: Marijuana. She reports that she does not drink alcohol.     Maternal Diabetes: No Genetic Screening: Normal Maternal Ultrasounds/Referrals: Normal Fetal Ultrasounds or other Referrals:  None Maternal Substance Abuse:  No Significant Maternal Medications:  None Significant Maternal Lab Results:  Group B Strep negative Number of Prenatal Visits:greater than 3 verified prenatal visits Other Comments:  None  Review of Systems Constitutional: Denies fevers/chills Cardiovascular: Denies chest pain or palpitations Pulmonary: Denies coughing or wheezing Gastrointestinal: Denies nausea, vomiting or diarrhea Genitourinary: Denies pelvic pain, unusual vaginal bleeding, unusual vaginal discharge, dysuria, urgency or frequency.  Musculoskeletal: Denies muscle or joint aches and pain.  Neurology:  Denies abnormal sensations such as tingling or numbness.   History Dilation: 10/ 100% effacement/+1 station.  Exam by: Dr. Sallye Ober.  Blood pressure 100/63, pulse 82, temperature 98.2 F (36.8 C), temperature source Oral, resp. rate 16, height 5' (1.524 m), weight 69.2 kg, SpO2 100 %, unknown if currently breastfeeding. Exam Physical Exam  Blood pressure 111/76, pulse 73, temperature 98.1 F (36.7 C), temperature source Oral, resp. rate 18, height 5' (1.524 m), weight 69.2 kg, SpO2 100 %, unknown if currently breastfeeding.  Vitals reviewed. Constitutional: She is oriented to person, place, and time. She appears well-developed and well-nourished.  HENT:  Head: Normocephalic and atraumatic.  Eyes: Conjunctivae and EOM are normal.  Neck: Normal range of motion.   Cardiovascular: Normal rate, regular rhythm, normal heart sounds and intact distal pulses.  Respiratory: Effort normal and breath sounds normal.  GI: Soft. She exhibits no mass. There is no tenderness.  Genitourinary: Gravid uterus, measuring AGA.  Musculoskeletal: Normal range of motion.  Neurological: She is alert and oriented to person, place, and time.  Skin: Skin is warm and dry.  Psychiatric: She has a normal mood and affect. Judgment normal.   Prenatal labs: ABO, Rh: --/--/O POS (09/12 4097) Antibody: NEG (09/12 0721) Rubella: Immune (03/13 0000) RPR: NON REACTIVE (09/12 0722)  HBsAg: Negative (03/13 0000)  HIV: Non-reactive (03/20 0000)  GBS: Negative/-- (08/24 0000)   Assessment/Plan: 26 y/o G3P3003 at [redacted] weeks EGA in labor, - Admit to Labor and Delivery. - Admit orders placed in. - Anticipate NSVD.  -  Patient declines postpartum bilateral tubal ligation at this time.   Prescilla Sours, MD.  07/14/2022, 8:30 AM.

## 2022-07-14 NOTE — MAU Note (Signed)
Pt says 3 cm last Friday Denies HSV GBS- neg UC's strong since 0200

## 2022-07-14 NOTE — Lactation Note (Signed)
This note was copied from a baby's chart. Lactation Consultation Note  Patient Name: Renee Acosta Today's Date: 07/14/2022   Age:27 hours  P2, Term, Infant Female  LC entered the room and the birth parent was changing baby.  Per the birth parent, she would like to be seen by lactation tomorrow.   Orvil Feil Rebeca Valdivia 07/14/2022, 5:56 PM

## 2022-07-14 NOTE — Lactation Note (Signed)
This note was copied from a baby's chart. Lactation Consultation Note  Patient Name: Renee Acosta Today's Date: 07/14/2022   Age: 27 hour  P3, Mother declined Lactation services in L&D since baby had already breastfed.  Lactation will check to see if mother would like lactation services on MBU.    LATCH Score Latch: Grasps breast easily, tongue down, lips flanged, rhythmical sucking.  Audible Swallowing: Spontaneous and intermittent  Type of Nipple: Everted at rest and after stimulation  Comfort (Breast/Nipple): Soft / non-tender  Hold (Positioning): No assistance needed to correctly position infant at breast.  LATCH Score: 10    Dahlia Byes [facility] 07/14/2022, 10:47 AM

## 2022-07-15 LAB — BIRTH TISSUE RECOVERY COLLECTION (PLACENTA DONATION)

## 2022-07-15 LAB — CBC
HCT: 29.8 % — ABNORMAL LOW (ref 36.0–46.0)
Hemoglobin: 9.4 g/dL — ABNORMAL LOW (ref 12.0–15.0)
MCH: 26.9 pg (ref 26.0–34.0)
MCHC: 31.5 g/dL (ref 30.0–36.0)
MCV: 85.1 fL (ref 80.0–100.0)
Platelets: 193 10*3/uL (ref 150–400)
RBC: 3.5 MIL/uL — ABNORMAL LOW (ref 3.87–5.11)
RDW: 14.9 % (ref 11.5–15.5)
WBC: 9.2 10*3/uL (ref 4.0–10.5)
nRBC: 0 % (ref 0.0–0.2)

## 2022-07-15 MED ORDER — ACETAMINOPHEN 325 MG PO TABS: 650.0000 mg | ORAL_TABLET | ORAL | 0 refills | Status: DC | PRN

## 2022-07-15 MED ORDER — IBUPROFEN 600 MG PO TABS: 600.0000 mg | ORAL_TABLET | Freq: Four times a day (QID) | ORAL | 0 refills | Status: DC

## 2022-07-15 MED ORDER — BENZOCAINE-MENTHOL 20-0.5 % EX AERO: 1.0000 | INHALATION_SPRAY | Freq: Four times a day (QID) | CUTANEOUS | 1 refills | Status: DC | PRN

## 2022-07-15 NOTE — Discharge Summary (Signed)
Postpartum Discharge Summary  Date of Service updated9/13/2023     Patient Name: Renee Acosta DOB: [birth year] MRN: [id number]  Date of admission: 07/14/2022 Delivery date:07/14/2022  Delivering provider: Waymon Amato  Date of discharge: 07/15/2022  Admitting diagnosis: Normal labor [O80, Z37.9] Normal vaginal delivery [O80] Intrauterine pregnancy: [redacted]w[redacted]d    Secondary diagnosis:  Principal Problem:   Normal labor Active Problems:   Normal vaginal delivery  Additional problems: none    Discharge diagnosis: Term Pregnancy Delivered                                              Post partum procedures: na Augmentation: N/A Complications: None  Hospital course: Onset of Labor With Vaginal Delivery      26y.o. yo G3P3003 at 38w0das admitted in Active Labor on 07/14/2022. Patient had an uncomplicated labor course as follows:  Membrane Rupture Time/Date: 7:28 AM ,07/14/2022   Delivery Method:Vaginal, Spontaneous  Episiotomy: None  Lacerations:  None  Patient had an uncomplicated postpartum course.  She is ambulating, tolerating a regular diet, passing flatus, and urinating well. Patient is discharged home in stable condition on 07/15/22.  Newborn Data: Birth date:07/14/2022  Birth time:7:58 AM  Gender:Female  Living status:Living  Apgars:7 ,9  Weight:4150 g   Magnesium Sulfate received: No BMZ received: No Rhophylac:No MMR:No T-DaP: not given Flu: No Transfusion:No  Physical exam  Vitals:   07/14/22 2000 07/14/22 2354 07/15/22 0535 07/15/22 1115  BP: 111/76 103/73 97/72 107/75  Pulse: 73 78 74 88  Resp: '18 18 17 18  ' Temp: 98.1 F (36.7 C) 97.9 F (36.6 C) 97.6 F (36.4 C) 98.3 F (36.8 C)  TempSrc: Oral Oral  Oral  SpO2: 100% 98% 97%   Weight:      Height:       General: alert Lochia: appropriate Uterine Fundus: firm Incision: N/A DVT Evaluation: Negative Homan's sign. Labs: Lab Results  Component Value Date   WBC 9.2 07/15/2022   HGB 9.4 (L)  07/15/2022   HCT 29.8 (L) 07/15/2022   MCV 85.1 07/15/2022   PLT 193 07/15/2022      Latest Ref Rng & Units 09/02/2016    2:39 PM  CMP  Glucose 65 - 99 mg/dL 93   BUN 6 - 20 mg/dL 10   Creatinine 0.57 - 1.00 mg/dL 0.80   Sodium 134 - 144 mmol/L 143   Potassium 3.5 - 5.2 mmol/L 4.6   Chloride 96 - 106 mmol/L 102   CO2 18 - 29 mmol/L 21   Calcium 8.7 - 10.2 mg/dL 9.5   Total Protein 6.0 - 8.5 g/dL 7.2   Total Bilirubin 0.0 - 1.2 mg/dL 0.4   Alkaline Phos 39 - 117 IU/L 53   AST 0 - 40 IU/L 15   ALT 0 - 32 IU/L 7    Edinburgh Score:    07/14/2022   10:00 AM  Edinburgh Postnatal Depression Scale Screening Tool  I have been able to laugh and see the funny side of things. 0  I have looked forward with enjoyment to things. 0  I have blamed myself unnecessarily when things went wrong. 3  I have been anxious or worried for no good reason. 1  I have felt scared or panicky for no good reason. 0  Things have been getting on top of me. 0  I have been so unhappy that I have had difficulty sleeping. 0  I have felt sad or miserable. 0  I have been so unhappy that I have been crying. 0  The thought of harming myself has occurred to me. 0  Edinburgh Postnatal Depression Scale Total 4      After visit meds:  Allergies as of 07/15/2022   No Known Allergies      Medication List     TAKE these medications    acetaminophen 325 MG tablet Commonly known as: Tylenol Take 2 tablets (650 mg total) by mouth every 4 (four) hours as needed (for pain scale < 4).   benzocaine-Menthol 20-0.5 % Aero Commonly known as: DERMOPLAST Apply 1 Application topically 4 (four) times daily as needed for irritation (perineal discomfort).   ibuprofen 600 MG tablet Commonly known as: ADVIL Take 1 tablet (600 mg total) by mouth every 6 (six) hours.   PRENATAL PO Take 1 tablet by mouth daily.         Discharge home in stable condition Infant Feeding: Breast Infant Disposition:home with  mother Discharge instruction: per After Visit Summary and Postpartum booklet. Activity: Advance as tolerated. Pelvic rest for 6 weeks.  Diet: routine diet Anticipated Birth Control: Unsure Postpartum Appointment:6 weeks Additional Postpartum F/U:  pp in 6 weeks Future Appointments:No future appointments. Follow up Visit:  Follow-up Information     Waymon Amato, MD. Schedule an appointment as soon as possible for a visit in 6 week(s).   Specialty: Obstetrics and Gynecology Why: For 6 week postpartum check. Contact information: [address] [place] [address] [phone]                     07/15/2022 Betsy Coder, MD

## 2022-07-15 NOTE — Lactation Note (Signed)
This note was copied from a baby's chart. Lactation Consultation Note  Patient Name: Renee Acosta Date: 07/15/2022   Age:27 hours Mom declined Lactation. Maternal Data    Feeding    LATCH Score                    Lactation Tools Discussed/Used    Interventions    Discharge    Consult Status      [name] 07/15/2022, 1:56 AM

## 2022-07-22 ENCOUNTER — Telehealth (HOSPITAL_COMMUNITY): Payer: Self-pay | Admitting: *Deleted

## 2022-07-22 NOTE — Telephone Encounter (Signed)
Left phone voicemail message.  Odis Hollingshead, RN 07-22-2022 at 9:17am

## 2023-10-20 ENCOUNTER — Encounter (HOSPITAL_BASED_OUTPATIENT_CLINIC_OR_DEPARTMENT_OTHER): Payer: Self-pay | Admitting: Obstetrics & Gynecology

## 2023-10-20 ENCOUNTER — Other Ambulatory Visit: Payer: Self-pay | Admitting: Obstetrics & Gynecology

## 2023-10-21 ENCOUNTER — Encounter (HOSPITAL_BASED_OUTPATIENT_CLINIC_OR_DEPARTMENT_OTHER): Payer: Self-pay | Admitting: Obstetrics & Gynecology

## 2023-10-21 ENCOUNTER — Other Ambulatory Visit: Payer: Self-pay

## 2023-10-21 NOTE — Progress Notes (Signed)
Spoke w/ via phone for pre-op interview---[facility] needs dos----cbc. Type & screen, UPT         Lab results------none COVID test -----patient states asymptomatic no test needed Arrive at -------0530 on Monday, 10/25/2023 NPO after MN NO Solid Food.  Clear liquids from MN until---0430 Med rec completed Medications to take morning of surgery -----none Diabetic medication -----n/a Patient instructed no nail polish to be worn day of surgery Patient instructed to bring photo id and insurance card day of surgery Patient aware to have Driver (ride ) / caregiver    for 24 hours after surgery - fiance Neal Dy Patient Special Instructions -----none Pre-Op special Instructions -----Patient is breastfeeding. Patient verbalized understanding of instructions that were given at this phone interview. Patient denies chest pain, sob, fever, cough at the interview.

## 2023-11-11 DIAGNOSIS — Z01818 Encounter for other preprocedural examination: Secondary | ICD-10-CM

## 2023-11-11 SURGERY — XI ROBOTIC ASSISTED SALPINGECTOMY
Anesthesia: General | Laterality: Bilateral

## 2023-11-23 ENCOUNTER — Other Ambulatory Visit: Payer: Self-pay | Admitting: Obstetrics & Gynecology

## 2023-11-29 NOTE — Progress Notes (Signed)
 COVID Vaccine Completed:  Date of COVID positive in last 90 days:  PCP - Jon Rummer, MD Cardiologist -   Chest x-ray -  EKG -  Stress Test -  ECHO -  Cardiac Cath -  Pacemaker/ICD device last checked: Spinal Cord Stimulator:  Bowel Prep -   Sleep Study -  CPAP -   Fasting Blood Sugar -  Checks Blood Sugar _____ times a day  Last dose of GLP1 agonist-  N/A GLP1 instructions:  Hold 7 days before surgery    Last dose of SGLT-2 inhibitors-  N/A SGLT-2 instructions:  Hold 3 days before surgery    Blood Thinner Instructions:  Time Aspirin Instructions: Last Dose:  Activity level:  Can go up a flight of stairs and perform activities of daily living without stopping and without symptoms of chest pain or shortness of breath.  Able to exercise without symptoms  Unable to go up a flight of stairs without symptoms of     Anesthesia review:   Patient denies shortness of breath, fever, cough and chest pain at PAT appointment  Patient verbalized understanding of instructions that were given to them at the PAT appointment. Patient was also instructed that they will need to review over the PAT instructions again at home before surgery.

## 2023-11-29 NOTE — Patient Instructions (Addendum)
SURGICAL WAITING ROOM VISITATION  Patients having surgery or a procedure may have no more than 2 support people in the waiting area - these visitors may rotate.    Children under the age of 43 must have an adult with them who is not the patient.  Due to an increase in RSV and influenza rates and associated hospitalizations, children ages 48 and under may not visit patients in [facility] hospitals.  Visitors with respiratory illnesses are discouraged from visiting and should remain at home.  If the patient needs to stay at the hospital during part of their recovery, the visitor guidelines for inpatient rooms apply. Pre-op nurse will coordinate an appropriate time for 1 support person to accompany patient in pre-op.  This support person may not rotate.    Please refer to the [facility] website for the visitor guidelines for Inpatients (after your surgery is over and you are in a regular room).    Your procedure is scheduled on: 12/10/23   Report to [facility] Main Entrance    Report to admitting at 5:45 AM   Call this number if you have problems the morning of surgery [phone]   Do not eat food or drink liquids :After Midnight.                                   Remember - BRUSH YOUR TEETH THE MORNING OF SURGERY WITH YOUR REGULAR TOOTHPASTE   Stop all vitamins and herbal supplements 7 days before surgery.   Take these medicines the morning of surgery with A SIP OF WATER: None              You may not have any metal on your body including hair pins, jewelry, and body piercing             Do not wear make-up, lotions, powders, perfumes, or deodorant  Do not wear nail polish including gel and S&S, artificial/acrylic nails, or any other type of covering on natural nails including finger and toenails. If you have artificial nails, gel coating, etc. that needs to be removed by a nail salon please have this removed prior to surgery or surgery may need to be canceled/ delayed if  the surgeon/ anesthesia feels like they are unable to be safely monitored.   Do not shave  48 hours prior to surgery.    Do not bring valuables to the hospital. [place] IS NOT             RESPONSIBLE   FOR VALUABLES.   Contacts, glasses, dentures or bridgework may not be worn into surgery.  DO NOT BRING YOUR HOME MEDICATIONS TO THE HOSPITAL. PHARMACY WILL DISPENSE MEDICATIONS LISTED ON YOUR MEDICATION LIST TO YOU DURING YOUR ADMISSION IN THE HOSPITAL!    Patients discharged on the day of surgery will not be allowed to drive home.  Someone NEEDS to stay with you for the first 24 hours after anesthesia.   Special Instructions: Bring a copy of your healthcare power of attorney and living will documents the day of surgery if you haven't scanned them before.              Please read over the following fact sheets you were given: IF YOU HAVE QUESTIONS ABOUT YOUR PRE-OP INSTRUCTIONS PLEASE CALL [phone]   If you received a COVID test during your pre-op visit  it is requested that you  wear a mask when out in public, stay away from anyone that may not be feeling well and notify your surgeon if you develop symptoms. If you test positive for Covid or have been in contact with anyone that has tested positive in the last 10 days please notify you surgeon.     - Preparing for Surgery Before surgery, you can play an important role.  Because skin is not sterile, your skin needs to be as free of germs as possible.  You can reduce the number of germs on your skin by washing with CHG (chlorahexidine gluconate) soap before surgery.  CHG is an antiseptic cleaner which kills germs and bonds with the skin to continue killing germs even after washing. Please DO NOT use if you have an allergy to CHG or antibacterial soaps.  If your skin becomes reddened/irritated stop using the CHG and inform your nurse when you arrive at Short Stay. Do not shave (including legs and underarms) for at least  48 hours prior to the first CHG shower.  You may shave your face/neck.  Please follow these instructions carefully:  1.  Shower with CHG Soap the night before surgery and the  morning of surgery.  2.  If you choose to wash your hair, wash your hair first as usual with your normal  shampoo.  3.  After you shampoo, rinse your hair and body thoroughly to remove the shampoo.                             4.  Use CHG as you would any other liquid soap.  You can apply chg directly to the skin and wash.  Gently with a scrungie or clean washcloth.  5.  Apply the CHG Soap to your body ONLY FROM THE NECK DOWN.   Do   not use on face/ open                           Wound or open sores. Avoid contact with eyes, ears mouth and   genitals (private parts).                       Wash face,  Genitals (private parts) with your normal soap.             6.  Wash thoroughly, paying special attention to the area where your    surgery  will be performed.  7.  Thoroughly rinse your body with warm water from the neck down.  8.  DO NOT shower/wash with your normal soap after using and rinsing off the CHG Soap.                9.  Pat yourself dry with a clean towel.            10.  Wear clean pajamas.            11.  Place clean sheets on your bed the night of your first shower and do not  sleep with pets. Day of Surgery : Do not apply any lotions/deodorants the morning of surgery.  Please wear clean clothes to the hospital/surgery center.  FAILURE TO FOLLOW THESE INSTRUCTIONS MAY RESULT IN THE CANCELLATION OF YOUR SURGERY  PATIENT SIGNATURE_________________________________  NURSE SIGNATURE__________________________________  ________________________________________________________________________ WHAT IS A BLOOD TRANSFUSION? Blood Transfusion Information  A transfusion is the replacement of blood or some  of its parts. Blood is made up of multiple cells which provide different functions. Red blood cells carry oxygen  and are used for blood loss replacement. White blood cells fight against infection. Platelets control bleeding. Plasma helps clot blood. Other blood products are available for specialized needs, such as hemophilia or other clotting disorders. BEFORE THE TRANSFUSION  Who gives blood for transfusions?  Healthy volunteers who are fully evaluated to make sure their blood is safe. This is blood bank blood. Transfusion therapy is the safest it has ever been in the practice of medicine. Before blood is taken from a donor, a complete history is taken to make sure that person has no history of diseases nor engages in risky social behavior (examples are intravenous drug use or sexual activity with multiple partners). The donor's travel history is screened to minimize risk of transmitting infections, such as malaria. The donated blood is tested for signs of infectious diseases, such as HIV and hepatitis. The blood is then tested to be sure it is compatible with you in order to minimize the chance of a transfusion reaction. If you or a relative donates blood, this is often done in anticipation of surgery and is not appropriate for emergency situations. It takes many days to process the donated blood. RISKS AND COMPLICATIONS Although transfusion therapy is very safe and saves many lives, the main dangers of transfusion include:  Getting an infectious disease. Developing a transfusion reaction. This is an allergic reaction to something in the blood you were given. Every precaution is taken to prevent this. The decision to have a blood transfusion has been considered carefully by your caregiver before blood is given. Blood is not given unless the benefits outweigh the risks. AFTER THE TRANSFUSION Right after receiving a blood transfusion, you will usually feel much better and more energetic. This is especially true if your red blood cells have gotten low (anemic). The transfusion raises the level of the red blood  cells which carry oxygen, and this usually causes an energy increase. The nurse administering the transfusion will monitor you carefully for complications. HOME CARE INSTRUCTIONS  No special instructions are needed after a transfusion. You may find your energy is better. Speak with your caregiver about any limitations on activity for underlying diseases you may have. SEEK MEDICAL CARE IF:  Your condition is not improving after your transfusion. You develop redness or irritation at the intravenous (IV) site. SEEK IMMEDIATE MEDICAL CARE IF:  Any of the following symptoms occur over the next 12 hours: Shaking chills. You have a temperature by mouth above 102 F (38.9 C), not controlled by medicine. Chest, back, or muscle pain. People around you feel you are not acting correctly or are confused. Shortness of breath or difficulty breathing. Dizziness and fainting. You get a rash or develop hives. You have a decrease in urine output. Your urine turns a dark color or changes to pink, red, or brown. Any of the following symptoms occur over the next 10 days: You have a temperature by mouth above 102 F (38.9 C), not controlled by medicine. Shortness of breath. Weakness after normal activity. The white part of the eye turns yellow (jaundice). You have a decrease in the amount of urine or are urinating less often. Your urine turns a dark color or changes to pink, red, or brown. Document Released: 10/16/2000 Document Revised: 01/11/2012 Document Reviewed: 06/04/2008 [facility] Patient Information 2014 [place], [place].  _______________________________________________________________________

## 2023-12-07 ENCOUNTER — Encounter (HOSPITAL_COMMUNITY)
Admission: RE | Admit: 2023-12-07 | Discharge: 2023-12-07 | Disposition: A | Discharge status: Home / Self Care | Payer: Medicaid Other | Source: Ambulatory Visit | Attending: Obstetrics & Gynecology | Admitting: Obstetrics & Gynecology

## 2023-12-07 DIAGNOSIS — Z01812 Encounter for preprocedural laboratory examination: Secondary | ICD-10-CM | POA: Insufficient documentation

## 2023-12-07 LAB — CBC
HCT: 38.6 % (ref 36.0–46.0)
Hemoglobin: 12.4 g/dL (ref 12.0–15.0)
MCH: 30.6 pg (ref 26.0–34.0)
MCHC: 32.1 g/dL (ref 30.0–36.0)
MCV: 95.3 fL (ref 80.0–100.0)
Platelets: 200 10*3/uL (ref 150–400)
RBC: 4.05 MIL/uL (ref 3.87–5.11)
RDW: 11.6 % (ref 11.5–15.5)
WBC: 5.2 10*3/uL (ref 4.0–10.5)
nRBC: 0 % (ref 0.0–0.2)

## 2023-12-07 NOTE — Progress Notes (Signed)
 COVID Vaccine Completed:  Date of COVID positive in last 90 days:  PCP - Jon Rummer, MD Cardiologist -   Chest x-ray -  EKG -  Stress Test -  ECHO -  Cardiac Cath -  Pacemaker/ICD device last checked: Spinal Cord Stimulator:  Bowel Prep -   Sleep Study -  CPAP -   Fasting Blood Sugar -  Checks Blood Sugar _____ times a day  Last dose of GLP1 agonist-  N/A GLP1 instructions:  Hold 7 days before surgery    Last dose of SGLT-2 inhibitors-  N/A SGLT-2 instructions:  Hold 3 days before surgery    Blood Thinner Instructions:  Time Aspirin Instructions: Last Dose:  Activity level:  Can go up a flight of stairs and perform activities of daily living without stopping and without symptoms of chest pain or shortness of breath.  Able to exercise without symptoms  Unable to go up a flight of stairs without symptoms of     Anesthesia review:   Patient denies shortness of breath, fever, cough and chest pain at PAT appointment  Patient verbalized understanding of instructions that were given to them at the PAT appointment. Patient was also instructed that they will need to review over the PAT instructions again at home before surgery.

## 2023-12-07 NOTE — Progress Notes (Signed)
 Unable to do pt's PST interview in person due to her having her 2 very young children with her. Hospital policy currently is not letting children under the age of 56 in. Pt. Did check in through admitting and also had her labs done. Verbally set up time of 3PM to call patient at home to do phone interview. Have attempted to call patient 3 times and left message on voicemail without a response.

## 2023-12-08 ENCOUNTER — Encounter (HOSPITAL_COMMUNITY): Payer: Self-pay

## 2023-12-08 ENCOUNTER — Other Ambulatory Visit: Payer: Self-pay

## 2023-12-08 NOTE — H&P (Signed)
 Renee Acosta is an 28 y.o. female para 3 who desires permanent sterilization via robot assisted laparoscopic bilateral salpingectomy.  Pertinent Gynecological History: Menses:  regular, monthly.  Bleeding: none currently.  Contraception: none DES exposure: unknown Blood transfusions: none Sexually transmitted diseases: no past history Previous GYN Procedures:  None.   Last mammogram: N/A.  Last pap: normal Date: 02/2023. OB History: G3, P3003   Menstrual History: Menarche age: 11 yrs Patient's last menstrual period was 10/09/2023 (exact date).    Past Medical History:  Diagnosis Date   Polycystic ovarian syndrome around 2015    Past Surgical History:  Procedure Laterality Date   VAGINAL DELIVERY     x3   WISDOM TOOTH EXTRACTION  2022    Family History  Problem Relation Age of Onset   Varicose Veins Mother    Varicose Veins Maternal Grandmother    Lupus Paternal Grandmother    Arthritis Paternal Grandmother    Ovarian cancer Paternal Grandmother    Varicose Veins Paternal Grandmother    Lung cancer Paternal Grandfather     Social History:  reports that she has never smoked. She has never used smokeless tobacco. She reports that she does not currently use drugs after having used the following drugs: Marijuana. She reports that she does not drink alcohol.  Allergies: No Known Allergies  No current outpatient medications   Review of Systems Constitutional: Denies fevers/chills Cardiovascular: Denies chest pain or palpitations Pulmonary: Denies coughing or wheezing Gastrointestinal: Denies nausea, vomiting or diarrhea Genitourinary: Denies pelvic pain, unusual vaginal bleeding, unusual vaginal discharge, dysuria, urgency or frequency.  Musculoskeletal: Denies muscle or joint aches and pain.  Neurology: Denies abnormal sensations such as tingling or numbness.    Last menstrual period 10/09/2023, currently breastfeeding. Physical Exam    12/10/2023    6:03 AM  12/08/2023    3:39 PM 10/21/2023   10:04 AM  Vitals with BMI  Height 4' 11 4' 11 4' 11  Weight 140 lbs 140 lbs 137 lbs  BMI 28.26 28.26 27.66  Systolic 104    Diastolic 73    Pulse 84      Constitutional: She is oriented to person, place, and time. She appears well-developed and well-nourished.  HENT:  Head: Normocephalic and atraumatic.  Neck: Normal range of motion.  Cardiovascular: Normal rate.    Respiratory: Effort normal.   GI: Soft.  Skin: Skin is warm and dry.  Psychiatric: She has a normal mood and affect. Her behavior is normal.   Genitourinary: Gravid uterus, appropriate for gestational age.     Recent Results (from the past 2160 hours)  CBC     Status: None   Collection Time: 12/07/23 10:53 AM  Result Value Ref Range   WBC 5.2 4.0 - 10.5 K/uL   RBC 4.05 3.87 - 5.11 MIL/uL   Hemoglobin 12.4 12.0 - 15.0 g/dL   HCT 61.3 63.9 - 53.9 %   MCV 95.3 80.0 - 100.0 fL   MCH 30.6 26.0 - 34.0 pg   MCHC 32.1 30.0 - 36.0 g/dL   RDW 88.3 88.4 - 84.4 %   Platelets 200 150 - 400 K/uL   nRBC 0.0 0.0 - 0.2 %    Comment: Performed at [facility], [address] [place], [place] [address]  Type and screen     Status: None   Collection Time: 12/07/23 10:53 AM  Result Value Ref Range   ABO/RH(D) O POS    Antibody Screen NEG  Sample Expiration 12/21/2023,2359    Extend sample reason      NO TRANSFUSIONS OR PREGNANCY IN THE PAST 3 MONTHS Performed at [facility], [address], [place], [place] [address]     12/10/2023: Urine pregnancy test: Negative.   Assessment/Plan: 28 y/o Para 3 here for permanent sterilization via robot assisted laparoscopic bilateral salpingectomy,  - Admit to [facility] OR as per admit orders, outpatient. - We discusssed risks, benefits and alternatives of robot assisted laparoscopic bilateral salpingectomy to include but not limited to risks of bleeding, infection, damage to organs, damage to nerves.  She  also understood risk of tubal regret but she states she was 100% sure she did not want any more children.  We discussed complete removal of tubes would ensure sterilization and may also decrease her future risk of ovarian and fallopian tube cancer.  She understood there were other kinds of birth control such as pills, patches, iuds, vaginal rings, depo provera which were temporary but she did not desire them.  She understood there was also an option of female sterilization but she did not desire that method either.  She did not desire laparoscopic fulguration or placement of tubal clips. All questions were answered.    Jerolyn LELON Foil, MD  12/10/2023, 7:53 AM

## 2023-12-08 NOTE — Progress Notes (Signed)
 Preop medical history completed over the phone.

## 2023-12-09 NOTE — Op Note (Addendum)
 Patient: Renee Acosta DOB: 01/03/1996 MRN: [id number] Date of procedure: 12/10/2023.   PREOP DIAGNOSIS:  40. 28 year old Para 3 female who desires permanent sterilization.      Post operative diagnosis:  Same as above.        Procedure:  Robot assisted laparoscopic bilateral salpingectomy.   Surgeon: Dr. Jerolyn Foil.  Assistant: Judyann Rattler, RNFA.   SURGEON ATTESTATION: I was present and scrubbed for the entire case.  An experienced assistant was required given the standard of surgical care given the complexity of the case.  This assistant was needed for exposure, retraction, instrument exchange and for overall help during the surgery.    Anesthesia: General ETA, Dr. Marien Laster.  Complications: None.  Findings: Normal uterus, normal left and right ovaries , normal left and right fallopian tubes. Normal bowels.   IV Fluids: Per anesthesia.   Urine:  200 cc.   EBL: 2 cc.   Indications: 28 y/o female Para 3 who desired permanent sterilization via bilateral salpingectomy.    Procedure: Informed consent was obtained from the patient to undergo the procedure after discussing risks that include but not limited to risks of bleeding, infection and damage to organs.  She was taken to the operating room and anesthesia was administered.  She was carefully positioned in the operating room table in dorsal lithotomy position with both arms tucked to her sides and on the Pink Trendelenburg Pad.  An exam done showed a small anteverted uterus and a closed cervix.  There were no palpable adnexal masses.   Abdomen was prepped with duraprep and perineum and vagina was prepped with betadine . She was  draped in the usual sterile fashion. A 16 french foley catheter was placed in.  A Graves speculum was placed into vagina.  Tenaculum was placed at anterior cervix.  The Acorn manipulator was placed into cervix.  Patient was placed in low dorsal lithotomy position.  Attention was then turned to the  abdomen.  0.25% marcaine  was injected in infraumbilical area and 3 mm incision made. Veress needle was placed in and correct placement was confirmed with irrigation, suction and hanging drop test.   Gas was instilled with low opening pressures noted and to a maximum pressure of 15 mm Hg.  The incision was extended for an 8 mm port.  The 8 mm robotic trocar was placed with direct visualization with the 0 degree scope.  Upon entry there was no notable injury to organs or vessels.   She was placed in T-burg position and maximum gas pressure was set to 15 mm Hg.  Two other side ports were placed on the left and right side of the abdomen under direct visualization for robot arms 1 and 3, lateral and a little below the infraumbilical incision, about 8 cm from umbilicus bilaterally.  The robot was docked.  The infraumbilical port was docked, 30 degree robot camera was placed in, targeting performed then the other ports were docked: Bipolar grasper was placed in Arm 1, Camera in arm 2, vessel sealer in arm 3.  The abdomen and pelvis were surveyed with the above findings.  From the fimbria to the cornual end the right fallopian tube was sealed then excised with the vessel sealer and placed in anterior cul de sac.  The left fallopian tube was similarly sealed and excised.  The specimens were delivered under direct visualization through arm 3 by the assistant using a laparoscopic grasper after removal of the vessel sealer.  Good hemostasis  was noted on the remaining pedicles.  The long bipolar grasper was then removed with direct visualization.  The [employer] was undocked.  Gas was turned off and was allowed to escape from the cavity, then the ports were removed under visualization with the 0 degree 5 mm camera.  Patient received five manual inflations from the anesthesia.  She was then taken out of Trendelenburg.  The skin incisions were closed with 4-0 monocryl.  Dermabond was then applied over the skin incisions.   Attention was then turned to perineum.  Speculum was placed in and Acorn manipulator and tenaculum removed.  Hemostasis on the cervix was achieved with silver  nitrate.  Patient was then cleaned and taken to the recovery room in stable condition.  Specimens: Left and right fallopian tubes to pathology.   Dr. Jerolyn Foil. 12/10/2023.

## 2023-12-10 ENCOUNTER — Other Ambulatory Visit: Payer: Self-pay

## 2023-12-10 ENCOUNTER — Encounter (HOSPITAL_COMMUNITY)
Admission: RE | Disposition: A | Discharge status: Home / Self Care | Payer: Self-pay | Source: Home / Self Care | Attending: Obstetrics & Gynecology

## 2023-12-10 ENCOUNTER — Ambulatory Visit (HOSPITAL_BASED_OUTPATIENT_CLINIC_OR_DEPARTMENT_OTHER): Payer: Medicaid Other | Admitting: Certified Registered"

## 2023-12-10 ENCOUNTER — Encounter (HOSPITAL_COMMUNITY): Payer: Self-pay | Admitting: Obstetrics & Gynecology

## 2023-12-10 ENCOUNTER — Ambulatory Visit (HOSPITAL_COMMUNITY): Payer: Medicaid Other | Admitting: Certified Registered"

## 2023-12-10 ENCOUNTER — Ambulatory Visit (HOSPITAL_COMMUNITY)
Admission: RE | Admit: 2023-12-10 | Discharge: 2023-12-10 | Disposition: A | Discharge status: Home / Self Care | Payer: Medicaid Other | Attending: Obstetrics & Gynecology | Admitting: Obstetrics & Gynecology

## 2023-12-10 DIAGNOSIS — Z302 Encounter for sterilization: Secondary | ICD-10-CM | POA: Diagnosis present

## 2023-12-10 HISTORY — PX: XI ROBOTIC ASSISTED SALPINGECTOMY: SHX6824

## 2023-12-10 LAB — TYPE AND SCREEN
ABO/RH(D): O POS
Antibody Screen: NEGATIVE

## 2023-12-10 LAB — POCT PREGNANCY, URINE: Preg Test, Ur: NEGATIVE

## 2023-12-10 SURGERY — SALPINGECTOMY, ROBOT-ASSISTED
Anesthesia: General | Laterality: Bilateral

## 2023-12-10 MED ORDER — FENTANYL CITRATE (PF) 100 MCG/2ML IJ SOLN
INTRAMUSCULAR | Status: DC | PRN
  Administered 2023-12-10 (×2): 50 ug via INTRAVENOUS

## 2023-12-10 MED ORDER — KETOROLAC TROMETHAMINE 30 MG/ML IJ SOLN
INTRAMUSCULAR | Status: AC
  Filled 2023-12-10: qty 1

## 2023-12-10 MED ORDER — MIDAZOLAM HCL 2 MG/2ML IJ SOLN
INTRAMUSCULAR | Status: AC
  Filled 2023-12-10: qty 2

## 2023-12-10 MED ORDER — ROCURONIUM BROMIDE 10 MG/ML (PF) SYRINGE
PREFILLED_SYRINGE | INTRAVENOUS | Status: AC
  Filled 2023-12-10: qty 10

## 2023-12-10 MED ORDER — ACETAMINOPHEN 500 MG PO TABS
1000.0000 mg | ORAL_TABLET | Freq: Once | ORAL | Status: AC
  Administered 2023-12-10: 1000 mg via ORAL
  Filled 2023-12-10: qty 2

## 2023-12-10 MED ORDER — LIDOCAINE 20MG/ML (2%) 15 ML SYRINGE OPTIME
INTRAMUSCULAR | Status: DC | PRN
  Administered 2023-12-10: 1.5 mg/kg/h via INTRAVENOUS

## 2023-12-10 MED ORDER — AMISULPRIDE (ANTIEMETIC) 5 MG/2ML IV SOLN
INTRAVENOUS | Status: AC
  Filled 2023-12-10: qty 4

## 2023-12-10 MED ORDER — METHYLENE BLUE (ANTIDOTE) 1 % IV SOLN
INTRAVENOUS | Status: AC
Start: 2023-12-10 — End: ?
  Filled 2023-12-10: qty 10

## 2023-12-10 MED ORDER — IBUPROFEN 600 MG PO TABS: 600.0000 mg | ORAL_TABLET | Freq: Four times a day (QID) | ORAL | 0 refills | Status: AC | PRN

## 2023-12-10 MED ORDER — OXYCODONE HCL 5 MG PO TABS: 5.0000 mg | ORAL_TABLET | ORAL | 0 refills | Status: AC | PRN

## 2023-12-10 MED ORDER — ORAL CARE MOUTH RINSE: 15.0000 mL | Freq: Once | OROMUCOSAL | Status: AC

## 2023-12-10 MED ORDER — FENTANYL CITRATE (PF) 100 MCG/2ML IJ SOLN
INTRAMUSCULAR | Status: AC
  Filled 2023-12-10: qty 2

## 2023-12-10 MED ORDER — ONDANSETRON HCL 4 MG/2ML IJ SOLN
INTRAMUSCULAR | Status: DC | PRN
  Administered 2023-12-10: 4 mg via INTRAVENOUS

## 2023-12-10 MED ORDER — CHLORHEXIDINE GLUCONATE 0.12 % MT SOLN
15.0000 mL | Freq: Once | OROMUCOSAL | Status: AC
  Administered 2023-12-10: 15 mL via OROMUCOSAL

## 2023-12-10 MED ORDER — BUPIVACAINE HCL (PF) 0.25 % IJ SOLN
INTRAMUSCULAR | Status: DC | PRN
  Administered 2023-12-10: 6 mL

## 2023-12-10 MED ORDER — DEXAMETHASONE SODIUM PHOSPHATE 10 MG/ML IJ SOLN
INTRAMUSCULAR | Status: AC
  Filled 2023-12-10: qty 1

## 2023-12-10 MED ORDER — POVIDONE-IODINE 10 % EX SWAB
2.0000 | Freq: Once | CUTANEOUS | Status: AC
  Administered 2023-12-10: 2 via TOPICAL

## 2023-12-10 MED ORDER — STERILE WATER FOR IRRIGATION IR SOLN
Status: DC | PRN
Start: 2023-12-10 — End: 2023-12-10
  Administered 2023-12-10: 1000 mL

## 2023-12-10 MED ORDER — SILVER NITRATE-POT NITRATE 75-25 % EX MISC
CUTANEOUS | Status: DC | PRN
  Administered 2023-12-10: 1

## 2023-12-10 MED ORDER — SUGAMMADEX SODIUM 200 MG/2ML IV SOLN
INTRAVENOUS | Status: DC | PRN
  Administered 2023-12-10 (×2): 200 mg via INTRAVENOUS

## 2023-12-10 MED ORDER — PROPOFOL 10 MG/ML IV BOLUS
INTRAVENOUS | Status: DC | PRN
  Administered 2023-12-10: 150 mg via INTRAVENOUS

## 2023-12-10 MED ORDER — AMISULPRIDE (ANTIEMETIC) 5 MG/2ML IV SOLN
10.0000 mg | Freq: Once | INTRAVENOUS | Status: AC
Start: 2023-12-10 — End: 2023-12-10
  Administered 2023-12-10: 10 mg via INTRAVENOUS

## 2023-12-10 MED ORDER — LACTATED RINGERS IV SOLN: INTRAVENOUS | Status: DC

## 2023-12-10 MED ORDER — DEXAMETHASONE SODIUM PHOSPHATE 10 MG/ML IJ SOLN
INTRAMUSCULAR | Status: DC | PRN
  Administered 2023-12-10: 10 mg via INTRAVENOUS

## 2023-12-10 MED ORDER — ONDANSETRON HCL 4 MG/2ML IJ SOLN
INTRAMUSCULAR | Status: AC
  Filled 2023-12-10: qty 2

## 2023-12-10 MED ORDER — LIDOCAINE HCL (PF) 2 % IJ SOLN
INTRAMUSCULAR | Status: AC
  Filled 2023-12-10: qty 5

## 2023-12-10 MED ORDER — KETOROLAC TROMETHAMINE 30 MG/ML IJ SOLN
INTRAMUSCULAR | Status: DC | PRN
  Administered 2023-12-10: 30 mg via INTRAVENOUS

## 2023-12-10 MED ORDER — LIDOCAINE HCL (CARDIAC) PF 100 MG/5ML IV SOSY
PREFILLED_SYRINGE | INTRAVENOUS | Status: DC | PRN
  Administered 2023-12-10: 60 mg via INTRATRACHEAL

## 2023-12-10 MED ORDER — ROCURONIUM BROMIDE 100 MG/10ML IV SOLN
INTRAVENOUS | Status: DC | PRN
  Administered 2023-12-10: 50 mg via INTRAVENOUS
  Administered 2023-12-10: 20 mg via INTRAVENOUS

## 2023-12-10 MED ORDER — FENTANYL CITRATE PF 50 MCG/ML IJ SOSY: 25.0000 ug | PREFILLED_SYRINGE | INTRAMUSCULAR | Status: DC | PRN

## 2023-12-10 MED ORDER — MIDAZOLAM HCL 2 MG/2ML IJ SOLN
INTRAMUSCULAR | Status: DC | PRN
  Administered 2023-12-10: 2 mg via INTRAVENOUS

## 2023-12-10 MED ORDER — BUPIVACAINE HCL 0.25 % IJ SOLN
INTRAMUSCULAR | Status: AC
  Filled 2023-12-10: qty 1

## 2023-12-10 SURGICAL SUPPLY — 68 items
APPLICATOR ARISTA FLEXITIP XL (MISCELLANEOUS) IMPLANT
CANNULA CAP OBTURATR AIRSEAL 8 (CAP) ×1 IMPLANT
CATH FOLEY 3WAY 5CC 16FR (CATHETERS) ×1 IMPLANT
COVER BACK TABLE 60X90IN (DRAPES) ×1 IMPLANT
COVER TIP SHEARS 8 DVNC (MISCELLANEOUS) ×1 IMPLANT
DEFOGGER SCOPE WARMER CLEARIFY (MISCELLANEOUS) ×1 IMPLANT
DERMABOND ADVANCED .7 DNX12 (GAUZE/BANDAGES/DRESSINGS) ×1 IMPLANT
DRAPE ARM DVNC X/XI (DISPOSABLE) ×4 IMPLANT
DRAPE COLUMN DVNC XI (DISPOSABLE) ×1 IMPLANT
DRAPE SURG IRRIG POUCH 19X23 (DRAPES) ×1 IMPLANT
DRAPE UTILITY XL STRL (DRAPES) ×1 IMPLANT
DRIVER NDL MEGA SUTCUT DVNCXI (INSTRUMENTS) ×1 IMPLANT
DRIVER NDLE MEGA SUTCUT DVNCXI (INSTRUMENTS) ×1
DURAPREP 26ML APPLICATOR (WOUND CARE) ×1 IMPLANT
ELECT REM PT RETURN 15FT ADLT (MISCELLANEOUS) ×1 IMPLANT
FORCEPS BPLR FENES DVNC XI (FORCEP) ×1 IMPLANT
FORCEPS PROGRASP DVNC XI (FORCEP) IMPLANT
GAUZE 4X4 16PLY ~~LOC~~+RFID DBL (SPONGE) IMPLANT
GLOVE BIOGEL PI IND STRL 7.0 (GLOVE) ×3 IMPLANT
GLOVE SURG SS PI 6.5 STRL IVOR (GLOVE) ×3 IMPLANT
HEMOSTAT ARISTA ABSORB 3G PWDR (HEMOSTASIS) IMPLANT
HOLDER FOLEY CATH W/STRAP (MISCELLANEOUS) IMPLANT
IRRIG SUCT STRYKERFLOW 2 WTIP (MISCELLANEOUS) ×1
IRRIGATION SUCT STRKRFLW 2 WTP (MISCELLANEOUS) ×1 IMPLANT
KIT TURNOVER KIT A (KITS) ×1 IMPLANT
LEGGING LITHOTOMY PAIR STRL (DRAPES) ×1 IMPLANT
MANIFOLD NEPTUNE II (INSTRUMENTS) ×1 IMPLANT
MANIPULATOR ADVINCU DEL 2.5 PL (MISCELLANEOUS) IMPLANT
MANIPULATOR ADVINCU DEL 3.0 PL (MISCELLANEOUS) IMPLANT
MANIPULATOR ADVINCU DEL 3.5 PL (MISCELLANEOUS) IMPLANT
MANIPULATOR ADVINCU DEL 4.0 PL (MISCELLANEOUS) IMPLANT
NDL INSUFFLATION 14GA 120MM (NEEDLE) ×1 IMPLANT
NEEDLE INSUFFLATION 14GA 120MM (NEEDLE) ×1
OBTURATOR OPTICAL STND 8 DVNC (TROCAR) ×1
OBTURATOR OPTICALSTD 8 DVNC (TROCAR) ×1 IMPLANT
OCCLUDER COLPOPNEUMO (BALLOONS) IMPLANT
PACK ROBOT WH (CUSTOM PROCEDURE TRAY) ×1 IMPLANT
PACK ROBOTIC GOWN (GOWN DISPOSABLE) ×1 IMPLANT
PAD OB MATERNITY 11 LF (PERSONAL CARE ITEMS) ×1 IMPLANT
PAD POSITIONING PINK XL (MISCELLANEOUS) ×1 IMPLANT
PAD PREP 24X48 CUFFED NSTRL (MISCELLANEOUS) ×1 IMPLANT
PROTECTOR NERVE ULNAR (MISCELLANEOUS) ×1 IMPLANT
RETRACTOR WND ALEXIS 18 MED (MISCELLANEOUS) IMPLANT
RTRCTR WOUND ALEXIS 18CM MED (MISCELLANEOUS)
RTRCTR WOUND ALEXIS 18CM SML (INSTRUMENTS)
SAVER CELL AAL HAEMONETICS (INSTRUMENTS) IMPLANT
SCISSORS MNPLR CVD DVNC XI (INSTRUMENTS) ×1 IMPLANT
SEAL UNIV 5-12 XI (MISCELLANEOUS) ×2 IMPLANT
SEALER VESSEL EXT DVNC XI (MISCELLANEOUS) IMPLANT
SET IRRIG Y TYPE TUR BLADDER L (SET/KITS/TRAYS/PACK) IMPLANT
SET TUBE FILTERED XL AIRSEAL (SET/KITS/TRAYS/PACK) ×1 IMPLANT
SLEEVE SCD COMPRESS KNEE MED (STOCKING) ×1 IMPLANT
SPIKE FLUID TRANSFER (MISCELLANEOUS) ×2 IMPLANT
SUT MNCRL AB 4-0 PS2 18 (SUTURE) IMPLANT
SUT VIC AB 0 CT1 27XBRD ANBCTR (SUTURE) IMPLANT
SUT VIC AB 4-0 PS2 18 (SUTURE) ×2 IMPLANT
SUT VICRYL 0 UR6 27IN ABS (SUTURE) IMPLANT
SUT VLOC 180 0 9IN GS21 (SUTURE) ×1 IMPLANT
TIP RUMI ORANGE 6.7MMX12CM (TIP) IMPLANT
TIP UTERINE 5.1X6CM LAV DISP (MISCELLANEOUS) IMPLANT
TIP UTERINE 6.7X10CM GRN DISP (MISCELLANEOUS) IMPLANT
TIP UTERINE 6.7X6CM WHT DISP (MISCELLANEOUS) IMPLANT
TIP UTERINE 6.7X8CM BLUE DISP (MISCELLANEOUS) IMPLANT
TOWEL OR 17X24 6PK STRL BLUE (TOWEL DISPOSABLE) ×1 IMPLANT
TRAY FOLEY MTR SLVR 16FR STAT (SET/KITS/TRAYS/PACK) IMPLANT
TROCAR PORT AIRSEAL 8X100 (TROCAR) IMPLANT
TUBING INSUFFLATION 10FT LAP (TUBING) IMPLANT
WATER STERILE IRR 1000ML POUR (IV SOLUTION) ×1 IMPLANT

## 2023-12-10 NOTE — Anesthesia Procedure Notes (Signed)
 Procedure Name: Intubation Date/Time: 12/10/2023 8:12 AM  Performed by: Emilio Rock DEL, CRNAPre-anesthesia Checklist: Patient identified, Emergency Drugs available, Suction available, Patient being monitored and Timeout performed Patient Re-evaluated:Patient Re-evaluated prior to induction Oxygen Delivery Method: Circle system utilized Preoxygenation: Pre-oxygenation with 100% oxygen Induction Type: IV induction Ventilation: Mask ventilation without difficulty Laryngoscope Size: Miller and 2 Grade View: Grade I Tube type: Oral Tube size: 7.0 mm Number of attempts: 1 Airway Equipment and Method: Stylet Placement Confirmation: positive ETCO2, ETT inserted through vocal cords under direct vision, CO2 detector and breath sounds checked- equal and bilateral Secured at: 21 cm Tube secured with: Tape Dental Injury: Teeth and Oropharynx as per pre-operative assessment

## 2023-12-10 NOTE — Transfer of Care (Signed)
 Immediate Anesthesia Transfer of Care Note  Patient: Renee Acosta  Procedure(s) Performed: XI ROBOTIC ASSISTED SALPINGECTOMY (Bilateral)  Patient Location: PACU  Anesthesia Type:General  Level of Consciousness: awake, alert , oriented, and patient cooperative  Airway & Oxygen Therapy: Patient connected to face mask oxygen  Post-op Assessment: Report given to RN and Post -op Vital signs reviewed and stable  Post vital signs: stable  Last Vitals:  Vitals Value Taken Time  BP 106/73 12/10/23 0939  Temp    Pulse 75 12/10/23 0940  Resp 22 12/10/23 0940  SpO2 87 % 12/10/23 0940  Vitals shown include unfiled device data.  Last Pain:  Vitals:   12/10/23 0603  TempSrc: Oral  PainSc: 0-No pain         Complications: No notable events documented.

## 2023-12-10 NOTE — Discharge Instructions (Signed)
 Renee Acosta, 1. You have surgical glue over the incision.  This will peel off by itself and will appear as a blue, brown sticky substance.    2. After showering please ensure that the incisions are patted dry with a towel. 3.Do not have any intercourse for the next two weeks.  4.  Expect incisional soreness and some upper back pain which could be from the gas that was used during the procedure.  5. You may take over the counter stool softener if with constipation.  6. You may use over counter tylenol  and prescribed ibuprofen  for pain as needed.  If it is not improving you may use oxycodone .  Let me know if despite oxycodone  use you experience unmanageable pain.   7.Please call me if with other symptoms such as nausea, vomiting, fevers or chills or anything different from your baseline, any problems or questions.  8.  Please keep your scheduled office visit in 2 weeks.   9. You may have some vaginal bleeding for the next few days. Please use a regular pad as needed and not tampons for this.  Let me know if bleeding is heavy or excessive, requiring a pad change every 2 hours or less.   Dr. Jerolyn Foil.  Phone: [phone]  Extension [phone] if regular business hours. If after hours please choose option to speak to provider on call.

## 2023-12-10 NOTE — Interval H&P Note (Signed)
 History and Physical Interval Note:  12/10/2023 7:54 AM  Renee Acosta  has presented today for surgery, with the diagnosis of sterilization.  The various methods of treatment have been discussed with the patient and family. After consideration of risks, benefits and other options for treatment, the patient has consented to  Procedure(s): XI ROBOTIC ASSISTED SALPINGECTOMY (Bilateral) as a surgical intervention.  The patient's history has been reviewed, patient examined, no change in status, stable for surgery.  I have reviewed the patient's chart and labs.  Questions were answered to the patient's satisfaction.     Jerolyn LELON Foil, MD.

## 2023-12-10 NOTE — Progress Notes (Signed)
 Phone call to anesthesiologist Norrine Bedford, MD.  OK for patient to continue breastfeeding.  No pump and dump necessary.    Juris Olmstead, RN

## 2023-12-10 NOTE — Anesthesia Preprocedure Evaluation (Addendum)
 Anesthesia Evaluation  Patient identified by MRN, date of birth, ID band Patient awake    Reviewed: Allergy & Precautions, NPO status , Patient's Chart, lab work & pertinent test results  Airway Mallampati: I  TM Distance: >3 FB Neck ROM: Full    Dental no notable dental hx. (+) Teeth Intact, Dental Advisory Given   Pulmonary neg pulmonary ROS   Pulmonary exam normal breath sounds clear to auscultation       Cardiovascular negative cardio ROS Normal cardiovascular exam Rhythm:Regular Rate:Normal     Neuro/Psych negative neurological ROS  negative psych ROS   GI/Hepatic negative GI ROS, Neg liver ROS,,,  Endo/Other  PCOS  Renal/GU negative Renal ROS  negative genitourinary   Musculoskeletal negative musculoskeletal ROS (+)    Abdominal   Peds  Hematology negative hematology ROS (+)   Anesthesia Other Findings   Reproductive/Obstetrics                             Anesthesia Physical Anesthesia Plan  ASA: 2  Anesthesia Plan: General   Post-op Pain Management: Tylenol  PO (pre-op)* and Toradol  IV (intra-op)*   Induction: Intravenous  PONV Risk Score and Plan: 3 and Midazolam , Dexamethasone  and Ondansetron   Airway Management Planned: Oral ETT  Additional Equipment:   Intra-op Plan:   Post-operative Plan: Extubation in OR  Informed Consent: I have reviewed the patients History and Physical, chart, labs and discussed the procedure including the risks, benefits and alternatives for the proposed anesthesia with the patient or authorized representative who has indicated his/her understanding and acceptance.     Dental advisory given  Plan Discussed with: CRNA  Anesthesia Plan Comments: (2 IVs)       Anesthesia Quick Evaluation

## 2023-12-10 NOTE — Anesthesia Postprocedure Evaluation (Signed)
 Anesthesia Post Note  Patient: Renee Acosta  Procedure(s) Performed: XI ROBOTIC ASSISTED SALPINGECTOMY (Bilateral)     Patient location during evaluation: PACU Anesthesia Type: General Level of consciousness: awake and alert Pain management: pain level controlled Vital Signs Assessment: post-procedure vital signs reviewed and stable Respiratory status: spontaneous breathing, nonlabored ventilation, respiratory function stable and patient connected to nasal cannula oxygen Cardiovascular status: blood pressure returned to baseline and stable Postop Assessment: no apparent nausea or vomiting Anesthetic complications: no  No notable events documented.  Last Vitals:  Vitals:   12/10/23 1015 12/10/23 1030  BP: 102/67 103/72  Pulse: 64 67  Resp: 18 18  Temp:    SpO2: 100% 100%    Last Pain:  Vitals:   12/10/23 1015  TempSrc:   PainSc: 0-No pain                 Serine Kea L Blane Worthington

## 2023-12-11 ENCOUNTER — Encounter (HOSPITAL_COMMUNITY): Payer: Self-pay | Admitting: Obstetrics & Gynecology

## 2023-12-13 LAB — SURGICAL PATHOLOGY
